# Patient Record
Sex: Female | Born: 1965 | Race: Asian | Hispanic: No | Marital: Married | Smoking: Never smoker
Health system: Southern US, Community
[De-identification: ages and names within clinical notes are randomized; demographics above are authoritative.]

## PROBLEM LIST (undated history)

## (undated) DIAGNOSIS — J302 Other seasonal allergic rhinitis: Secondary | ICD-10-CM

## (undated) DIAGNOSIS — M8580 Other specified disorders of bone density and structure, unspecified site: Secondary | ICD-10-CM

## (undated) DIAGNOSIS — T7840XA Allergy, unspecified, initial encounter: Secondary | ICD-10-CM

## (undated) DIAGNOSIS — D509 Iron deficiency anemia, unspecified: Secondary | ICD-10-CM

## (undated) HISTORY — DX: Other seasonal allergic rhinitis: J30.2

## (undated) HISTORY — DX: Iron deficiency anemia, unspecified: D50.9

## (undated) HISTORY — DX: Other specified disorders of bone density and structure, unspecified site: M85.80

## (undated) HISTORY — DX: Allergy, unspecified, initial encounter: T78.40XA

## (undated) HISTORY — PX: OTHER SURGICAL HISTORY: SHX169

---

## 2009-06-03 ENCOUNTER — Other Ambulatory Visit: Admission: RE | Admit: 2009-06-03 | Discharge: 2009-06-03 | Payer: Self-pay | Admitting: Internal Medicine

## 2009-06-03 ENCOUNTER — Ambulatory Visit: Payer: Self-pay | Admitting: Diagnostic Radiology

## 2009-06-03 ENCOUNTER — Ambulatory Visit (HOSPITAL_BASED_OUTPATIENT_CLINIC_OR_DEPARTMENT_OTHER): Admission: RE | Admit: 2009-06-03 | Discharge: 2009-06-03 | Payer: Self-pay | Admitting: Internal Medicine

## 2009-06-03 ENCOUNTER — Ambulatory Visit: Payer: Self-pay | Admitting: Internal Medicine

## 2009-06-03 DIAGNOSIS — G5600 Carpal tunnel syndrome, unspecified upper limb: Secondary | ICD-10-CM | POA: Insufficient documentation

## 2009-06-03 DIAGNOSIS — R519 Headache, unspecified: Secondary | ICD-10-CM | POA: Insufficient documentation

## 2009-06-03 DIAGNOSIS — R51 Headache: Secondary | ICD-10-CM | POA: Insufficient documentation

## 2009-06-03 LAB — HM MAMMOGRAPHY: HM Mammogram: NEGATIVE

## 2009-06-10 ENCOUNTER — Ambulatory Visit: Payer: Self-pay | Admitting: Family

## 2009-06-10 LAB — CONVERTED CEMR LAB
Basophils Relative: 1 % (ref 0–1)
CO2: 25 meq/L (ref 19–32)
Chloride: 106 meq/L (ref 96–112)
Eosinophils Absolute: 0.1 10*3/uL (ref 0.0–0.7)
Glucose, Bld: 88 mg/dL (ref 70–99)
HDL: 59 mg/dL (ref >39)
Hemoglobin: 11.5 g/dL — ABNORMAL LOW (ref 12.0–15.0)
LDL Cholesterol: 112 mg/dL — ABNORMAL HIGH (ref 0–99)
Lymphs Abs: 2.1 10*3/uL (ref 0.7–4.0)
MCHC: 32.4 g/dL (ref 30.0–36.0)
MCV: 78 fL (ref 78.0–100.0)
Monocytes Absolute: 0.5 10*3/uL (ref 0.1–1.0)
Monocytes Relative: 8 % (ref 3–12)
Neutro Abs: 3.8 10*3/uL (ref 1.7–7.7)
Neutrophils Relative %: 58 % (ref 43–77)
RBC: 4.55 M/uL (ref 3.87–5.11)
Sodium: 139 meq/L (ref 135–145)
Total CHOL/HDL Ratio: 3.1
VLDL: 10 mg/dL (ref 0–40)
WBC: 6.6 10*3/uL (ref 4.0–10.5)

## 2009-06-13 ENCOUNTER — Telehealth (INDEPENDENT_AMBULATORY_CARE_PROVIDER_SITE_OTHER): Payer: Self-pay | Admitting: *Deleted

## 2009-06-13 DIAGNOSIS — D509 Iron deficiency anemia, unspecified: Secondary | ICD-10-CM | POA: Insufficient documentation

## 2009-06-21 ENCOUNTER — Encounter (INDEPENDENT_AMBULATORY_CARE_PROVIDER_SITE_OTHER): Payer: Self-pay | Admitting: *Deleted

## 2009-07-01 ENCOUNTER — Encounter (INDEPENDENT_AMBULATORY_CARE_PROVIDER_SITE_OTHER): Payer: Self-pay | Admitting: *Deleted

## 2009-07-01 ENCOUNTER — Ambulatory Visit: Payer: Self-pay | Admitting: Family

## 2009-07-01 LAB — CONVERTED CEMR LAB
Iron: 56 ug/dL (ref 42–145)
UIBC: 270 ug/dL

## 2009-07-11 ENCOUNTER — Ambulatory Visit: Payer: Self-pay | Admitting: Family

## 2009-07-11 LAB — CONVERTED CEMR LAB: Fecal Occult Bld: NEGATIVE

## 2009-09-02 ENCOUNTER — Ambulatory Visit: Payer: Self-pay | Admitting: Family

## 2009-09-02 LAB — CONVERTED CEMR LAB
Eosinophils Relative: 2 % (ref 0–5)
HCT: 36.3 % (ref 36.0–46.0)
Hemoglobin: 11.8 g/dL — ABNORMAL LOW (ref 12.0–15.0)
Lymphocytes Relative: 38 % (ref 12–46)
Lymphs Abs: 2.1 10*3/uL (ref 0.7–4.0)
MCV: 78.9 fL (ref 78.0–100.0)
Monocytes Absolute: 0.4 10*3/uL (ref 0.1–1.0)
Monocytes Relative: 8 % (ref 3–12)
Platelets: 269 10*3/uL (ref 150–400)
RBC: 4.6 M/uL (ref 3.87–5.11)
WBC: 5.5 10*3/uL (ref 4.0–10.5)

## 2009-09-05 ENCOUNTER — Encounter: Payer: Self-pay | Admitting: Family

## 2010-03-24 ENCOUNTER — Ambulatory Visit (HOSPITAL_BASED_OUTPATIENT_CLINIC_OR_DEPARTMENT_OTHER): Admission: RE | Admit: 2010-03-24 | Discharge: 2010-03-24 | Payer: Self-pay | Admitting: Internal Medicine

## 2010-03-24 ENCOUNTER — Ambulatory Visit: Payer: Self-pay | Admitting: Diagnostic Radiology

## 2010-03-24 ENCOUNTER — Ambulatory Visit: Payer: Self-pay | Admitting: Family

## 2010-03-24 LAB — CONVERTED CEMR LAB
Eosinophils Absolute: 0.1 10*3/uL (ref 0.0–0.7)
Hemoglobin: 11.1 g/dL — ABNORMAL LOW (ref 12.0–15.0)
Lymphs Abs: 2.8 10*3/uL (ref 0.7–4.0)
MCV: 78 fL (ref 78.0–100.0)
Monocytes Absolute: 0.6 10*3/uL (ref 0.1–1.0)
Monocytes Relative: 8 % (ref 3–12)
Neutrophils Relative %: 51 % (ref 43–77)
RBC: 4.36 M/uL (ref 3.87–5.11)
WBC: 7.2 10*3/uL (ref 4.0–10.5)

## 2010-03-27 ENCOUNTER — Encounter: Payer: Self-pay | Admitting: Family

## 2010-05-26 ENCOUNTER — Other Ambulatory Visit
Admission: RE | Admit: 2010-05-26 | Discharge: 2010-05-26 | Payer: Self-pay | Source: Home / Self Care | Admitting: Internal Medicine

## 2010-05-26 ENCOUNTER — Other Ambulatory Visit: Payer: Self-pay | Admitting: Family

## 2010-05-26 ENCOUNTER — Ambulatory Visit
Admission: RE | Admit: 2010-05-26 | Discharge: 2010-05-26 | Payer: Self-pay | Source: Home / Self Care | Attending: Family | Admitting: Family

## 2010-05-28 ENCOUNTER — Telehealth: Payer: Self-pay | Admitting: Family

## 2010-06-06 NOTE — Assessment & Plan Note (Signed)
Summary: f/u on labs- jr   Vital Signs:  Patient profile:   45 year old female Menstrual status:  regular Weight:      110.25 pounds BMI:     21.61 Temp:     98.4 degrees F oral Pulse rate:   84 / minute Pulse rhythm:   regular Resp:     12 per minute BP sitting:   118 / 78  (right arm) Cuff size:   regular  Vitals Entered By: Mervin Kung CMA (July 01, 2009 2:27 PM) CC: room 5  follow up on labs` Is Patient Diabetic? No   CC:  room 5  follow up on labs`.  History of Present Illness: Ms Nicole Schneider presents today for follow up of her microcytic anemia.  Notes that her period lasts 1-2 days and is generally very light.  Denies black stools.    Allergies (verified): No Known Drug Allergies  Physical Exam  General:  Well-developed,well-nourished,in no acute distress; alert,appropriate and cooperative throughout examination Lungs:  Normal respiratory effort, chest expands symmetrically. Lungs are clear to auscultation, no crackles or wheezes. Heart:  Normal rate and regular rhythm. S1 and S2 normal without gallop, murmur, click, rub or other extra sounds. Abdomen:  Bowel sounds positive,abdomen soft and non-tender without masses, organomegaly or hernias noted.   Impression & Recommendations:  Problem # 1:  ANEMIA, IRON DEFICIENCY, MICROCYTIC (ICD-280.8) Assessment New Plan to check labs and IFOB- add iron supplement. Plan f/u CBC in 2 months.  Orders: Iron Binding Cap (TIBC)-[facility] ([id number]) T-Iron [id number]) Ferritin-[facility] [phone]) T-Vitamin B12 [phone]) Folate-[facility] [phone])  Her updated medication list for this problem includes:    Ferrous Sulfate 325 (65 Fe) Mg Tabs (Ferrous sulfate) ..... One tablet by mouth two times a day  Complete Medication List: 1)  Vicks Dayquil Multi-symptom 10-5-325 Mg Caps (Dm-phenylephrine-acetaminophen) .... As needed 2)  Vicks Nyquil Multi-symptom 15-6.25-325 Mg Caps (Dm-doxylamine-acetaminophen) .... As needed 3)   Ferrous Sulfate 325 (65 Fe) Mg Tabs (Ferrous sulfate) .... One tablet by mouth two times a day  Patient Instructions: 1)  Please complete your stool studies and return by mail. 2)  Please start iron and follow up in 2 months for bloodwork 3)  CBC (280.8)  Current Allergies (reviewed today): No known allergies

## 2010-06-06 NOTE — Letter (Signed)
   [place] at [facility] [address] [place], [place]  [address]  [place] Phone: [phone]      Sep 05, 2009   [facility] [place] [address] [place], [place] [address]  RE:  LAB RESULTS  Dear  Ms. Ziomara,  The following is an interpretation of your most recent lab tests.  Please take note of any instructions provided or changes to medications that have resulted from your lab work.   CBC:  Stable - no changes needed Your blood count is slightly improved.  Please continue your iron supplement. Please follow up in 3 months.   Sincerely Yours,    Lemont Fillers FNP

## 2010-06-06 NOTE — Letter (Signed)
   [place] at [facility] [address] [place], [place]  [address]  [place] Phone: [phone]      July 11, 2009   [facility] [place] [address] [place], [place] [address]  RE:  LAB RESULTS  Dear  Ms. Clay,  The following is an interpretation of your most recent lab tests.  Please take note of any instructions provided or changes to medications that have resulted from your lab work.  Hemoccult Test for blood in stool:  negative    Sincerely Yours,    Lemont Fillers FNP

## 2010-06-06 NOTE — Assessment & Plan Note (Signed)
Summary: new to est.- jr   Vital Signs:  Patient profile:   45 year old female Menstrual status:  regular LMP:     05/20/2009 Height:      60 inches Weight:      108 pounds BMI:     21.17 Pulse rate:   68 / minute BP sitting:   120 / 68  (right arm)  Vitals Entered By: Doristine Devoid (June 03, 2009 2:54 PM) CC: NEW EST- cpx and pap- also would like mammogram LMP (date): 05/20/2009     Menstrual Status regular Enter LMP: 05/20/2009   CC:  NEW EST- cpx and pap- also would like mammogram.  History of Present Illness: Ms Nicole Schneider is a 45 year old Burmese female who presents today to establish care.  She has not seen a doctor in ove 10 years since she moved to the [place].  Today she is accompanied by her son who assists in translating as her English is limited.  Patient complains of numbness in her hands and some low back pain. She has also had headaches in the AM for several weeks.  She also has had some associated nausea in the morning.    Preventative-  She had a mammogram 3 yrs ago- only had 1 pap smear which was in 2001.  Does not exercise.  Diet is reasonably healthy.  Had flu shot, needs Td.    Preventive Screening-Counseling & Management  Alcohol-Tobacco     Smoking Status: never      Drug Use:  no.    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   Smoking Status:  never Drug Use:  no  Review of Systems       Constitutional: Denies Fever ENT:  Denies nasal congestion or sore throat. Resp: Denies cough, some mild congestion/throat clearing CV:   Denies Chest Pain GI:  Denies nausea or vomitting GU: Denies dysuria Lymphatic: Denies lymphadenopathy Musculoskeletal:  notes that her hands are numb in Am when she wakes up then get better Skin:  Denies Rashes Psychiatric: Denies depression or anxiety Neuro: + AM numbness in bilateral hands.       Physical Exam  General:  thin asian female in NAD Head:  Normocephalic and atraumatic without obvious abnormalities. No apparent alopecia or balding. Eyes:  No corneal or conjunctival inflammation noted. EOMI. Perrla. Funduscopic exam benign, without hemorrhages, exudates or papilledema. Vision grossly normal. Ears:  External ear exam shows no significant lesions or deformities.  Otoscopic examination reveals clear canals, tympanic membranes are intact bilaterally without bulging, retraction, inflammation or discharge. Hearing is grossly normal bilaterally. Mouth:  Oral mucosa and oropharynx without lesions or exudates.   Neck:  No deformities, masses, or tenderness noted. No thyroid enlargement noted.   Breasts:  No mass, nodules, thickening, tenderness, bulging, retraction, inflamation, nipple discharge or skin changes noted.   Lungs:  Normal respiratory effort, chest expands symmetrically. Lungs are clear to auscultation, no crackles or wheezes. Heart:  Normal rate and regular rhythm. S1 and S2 normal without gallop, murmur, click, rub or other extra sounds. Abdomen:  Bowel sounds positive,abdomen soft and  non-tender without masses, organomegaly or hernias noted. Genitalia:  Pelvic Exam:        External: normal female genitalia without lesions or masses        Vagina: normal without lesions or masses        Cervix: normal without lesions or masses        Adnexa: normal bimanual exam without masses or fullness        Uterus: normal by palpation        Pap smear: performed Msk:  No deformity or scoliosis noted of thoracic or lumbar spine.   Extremities:  No clubbing, cyanosis, edema, or  deformity noted with normal full range of motion of all joints.   Neurologic:  alert & oriented X3, strength normal in all extremities, gait normal, and DTRs symmetrical and normal.   Skin:  Intact without suspicious lesions or rashes Cervical Nodes:  No lymphadenopathy noted Axillary Nodes:  No palpable lymphadenopathy Psych:  Cognition and judgment appear intact. Alert and cooperative with normal attention span and concentration. No apparent delusions, illusions, hallucinations   Impression & Recommendations:  Problem # 1:  Preventive Health Care (ICD-V70.0) Assessment Comment Only  Will have patient return fasting for blood work.  Patient counselled on healthy diet/exercise.  Immunizations reviewed- patient recieved tetanus shot.   Pap performed today.  Will refer for mammogram.    Orders: Pap Smear, Thin Prep ( Collection of) (K4401)  Problem # 2:  CARPAL TUNNEL SYNDROME, BILATERAL (ICD-354.0) Assessment: New recommended bilateral wrist braces, ibuprofen.  Patient works with her hands and performs repetative motions.  If no improvement with these measures, may consider referral to orthopedics.  Problem # 3:  HEADACHE (ICD-784.0) Assessment: New patient notes AM headaches for last several weeks which is accompanied by nausea.  Urine pregnancy test is negative.  NSAIDS as needed, monitor for now.  If HA's persist, will consider further w/u.    Other Orders: Mammogram (Screening) (Mammo) TD Toxoids IM 7 YR + (02725) Admin 1st Vaccine (36644) Urine Pregnancy Test  (03474)  Patient Instructions: 1)  Please return fasting for the following blood tests: 2)  CBC, BMET, FLP (ICD-9 V70) 3)  It is important that you exercise regularly at least 20 minutes 5 times a week. If you develop chest pain, have severe difficulty breathing, or feel very tired , stop exercising immediately and seek medical attention. 4)  Use wrist braces on both hands when you sleep at night, you can try using  ibuprofen 400mg  every 6 hours as needed for the symptoms of hand numbness.  Let us know if these symptoms do not improve and we can send you to a specialist.   5)  Please stop by the radiology department on the 1st floor on your way out to arrange your mammogram.   6)  Please schedule a follow-up appointment in 1 month.   Tetanus Vaccine (to be given today)  Laboratory Results   Urine Tests      Urine HCG: negative

## 2010-06-06 NOTE — Progress Notes (Signed)
Summary: discuss labs-lmomx2  Phone Note Outgoing Call   Summary of Call: Please ask Ms Nicole Schneider to return for an anemia panel and IFOB (280.8).  Thanks Initial call taken by: Lemont Fillers FNP,  June 13, 2009 12:29 PM  Follow-up for Phone Call        left message on machine .........Marland KitchenDoristine Schneider  June 14, 2009 9:24 AM   left message on machine .Marland KitchenMarland KitchenDoristine Schneider  June 16, 2009 2:03 PM   will mail letter to have patient contact office to discuss.........Marland KitchenDoristine Schneider  June 21, 2009 10:29 AM   New Problems: ANEMIA, IRON DEFICIENCY, MICROCYTIC (ICD-280.8)   New Problems: ANEMIA, IRON DEFICIENCY, MICROCYTIC (ICD-280.8)

## 2010-06-06 NOTE — Letter (Signed)
Summary: [facility]: Immunoassay Fecal Occult Blood (iFOB) Order Psychologist, counselling at [facility]  [address]   [place], [place] [address]   Phone: [phone]  Fax: [phone]      [facility]: Immunoassay Fecal Occult Blood (iFOB) Order Form   July 01, 2009 MRN: [id number]   [facility] Nicole Schneider 08-01-1965   Physicican Name:____Melissa O'Sullivan_______  Diagnosis Code:_______280.8___________________      Mervin Kung CMA

## 2010-06-06 NOTE — Assessment & Plan Note (Signed)
Summary: PAIN IN RIB CAGE/HEA--Rm 4   Vital Signs:  Patient profile:   45 year old female Menstrual status:  regular Height:      60 inches Weight:      105 pounds BMI:     20.58 Temp:     97.8 degrees F oral Pulse rate:   60 / minute Pulse rhythm:   regular Resp:     16 per minute BP sitting:   110 / 70  (right arm) Cuff size:   regular  Vitals Entered By: Mervin Kung CMA Duncan Dull) (March 24, 2010 2:58 PM) CC: Pt states she is having intermittent left rib pain since falling down steps 3 months ago.  Wants flu shot today. Is Patient Diabetic? No Pain Assessment Patient in pain? no      Comments Pt would like flu shot today. Nicki Guadalajara Fergerson CMA Duncan Dull)  March 24, 2010 3:06 PM    Primary Care Provider:  Lemont Fillers FNP  CC:  Pt states she is having intermittent left rib pain since falling down steps 3 months ago.  Wants flu shot today.Marland Kitchen  History of Present Illness: Left sided rib pain 3 months ago- fell down the stairs to basement.  Tripped and fell forward down the stairss.  Denies head injury.  Pain is "inside" on left rib cage.  Intermittent in nature.   Not worsened with inspiration, worse with movement.  Denies shortness of breath.  Denies hemoptysis or cough.  Improved by nothing.  Son presents today with the patient and assists with translation.     Allergies (verified): No Known Drug Allergies  Past History:  Past Medical History: Last updated: 06/03/2009 none  Past Surgical History: Last updated: 06/03/2009 none  Family History: Reviewed history from 06/03/2009 and no changes required. CAD-no HTN-no DM-no STROKE-no COLON CA-no BREAST CA-no  Mom-deceased at 71 unknown cause Dad- 68 years old healthy  Patient has 9 siblings- all healthy to her knowledge  Social History: Reviewed history from 06/03/2009 and no changes required. 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    Review of Systems       see HPI  Physical Exam  General:  Well-developed,well-nourished,in no acute distress; alert,appropriate and cooperative throughout examination Head:  Normocephalic and atraumatic without obvious abnormalities. No apparent alopecia or balding. Neck:  No deformities, masses, or tenderness noted. Lungs:  Normal respiratory effort, chest expands symmetrically. Lungs are clear to auscultation, no crackles or wheezes. Heart:  Normal rate and regular rhythm. S1 and S2 normal without gallop, murmur, click, rub or other extra sounds. Msk:  No tenderness to palpation of L rib cage.   Extremities:  No clubbing, cyanosis, edema, or deformity noted  Psych:  Cognition and judgment appear intact. Alert and cooperative with normal attention span and concentration. No apparent delusions, illusions, hallucinations   Impression & Recommendations:  Problem # 1:  RIB PAIN, LEFT SIDED (ICD-786.50) Assessment New  S/p fall 3 months ago.  Will check urine HCG, if neg will send for x-ray.  Recommended tylenol as needed pain.    Orders: T-DG Ribs Unilateral*L* (71100)  Problem # 2:  ANEMIA, IRON DEFICIENCY, MICROCYTIC (ICD-280.8) Assessment: Comment Only IFOB was negative, pt reports continuing iron.  Will repeat CBC. Her updated medication list for this problem includes:    Ferrous Sulfate 325 (65 Fe) Mg Tabs (Ferrous sulfate) ..... One tablet by mouth two times a day  Orders:  TLB-CBC Platelet - w/Differential (85025-CBCD)  Complete Medication List: 1)  Ferrous Sulfate 325 (65 Fe) Mg Tabs (Ferrous sulfate) .... One tablet by mouth two times a day  Other Orders: Urine Pregnancy Test  ([address]) Admin 1st Vaccine (60454) Flu Vaccine 42yrs + (09811)  Patient Instructions: 1)  Please complete your x-ray and blood work downstairs today. 2)  You may use tylenol as needed for pain. 3)  Take 650-1000mg  of Tylenol every 4-6 hours as needed for  relief of pain. AVOID taking more than 4000mg   in a 24 hour period (can cause liver damage in higher doses). 4)  Call if symptoms worsen or do not improve.    Orders Added: 1)  T-DG Ribs Unilateral*L* [71100] 2)  TLB-CBC Platelet - w/Differential [85025-CBCD] 3)  Est. Patient Level III [91478] 4)  Urine Pregnancy Test  [81025] 5)  Admin 1st Vaccine [90471] 6)  Flu Vaccine 67yrs + [29562] 7)  Est. Patient Level IV [13086]    Current Allergies (reviewed today): No known allergies    Laboratory Results   Urine Tests   Date/Time Reported: Mervin Kung CMA Duncan Dull)  March 24, 2010 3:35 PM     Urine HCG: negative    Flu Vaccine Consent Questions     Do you have a history of severe allergic reactions to this vaccine? no    Any prior history of allergic reactions to egg and/or gelatin? no    Do you have a sensitivity to the preservative Thimersol? no    Do you have a past history of Guillan-Barre Syndrome? no    Do you currently have an acute febrile illness? no    Have you ever had a severe reaction to latex? no    Vaccine information given and explained to patient? yes    Are you currently pregnant? no    Lot Number:[id number]   Exp Date:11/04/2010   Site Given  Left Deltoid IM. Nicki Guadalajara Fergerson CMA Duncan Dull)  March 24, 2010 3:36 PM

## 2010-06-06 NOTE — Letter (Signed)
   [place] at [facility] [address] [place], [place]  [address]  [place] Phone: [phone]      June 10, 2009   [facility] [place] [address] [place], [place] [address]  RE:  LAB RESULTS  Dear  Ms. Ivee,  The following is an interpretation of your most recent lab tests.  Please take note of any instructions provided or changes to medications that have resulted from your lab work.  Pap Smear: normal   Sincerely Yours,    Lemont Fillers FNP

## 2010-06-06 NOTE — Letter (Signed)
   [place] at [facility] [address] [place], [place]  [address]  [place] Phone: [phone]      March 27, 2010   [facility] [place] [address] [place], [place] [address]  RE:  LAB RESULTS  Dear  Ms. Analiz,  The following is an interpretation of your most recent lab tests.  Please take note of any instructions provided or changes to medications that have resulted from your lab work.   CBC:  Stable - no changes needed You are still slightly anemic.  Continue your iron supplement.   Please follow up in 6 months.   Sincerely Yours,    Lemont Fillers FNP  Appended Document:  Mailed.

## 2010-06-06 NOTE — Letter (Signed)
Summary: Primary Care Appointment Letter  [place] [facility] [place]  [address] [place], [place] [address]   Phone: [phone]  Fax: [phone]    06/21/2009 MRN: [id number]  Nicole Schneider [address] [place], [place]  [address]  Dear [name],   Your Primary Care Physician Lemont Fillers FNP has indicated that:    _______it is time to schedule an appointment.    _______you missed your appointment on______ and need to call and          reschedule.    _______you need to have lab work done.    ____X___you need to contact the office discuss lab or test results.    _______you need to call to reschedule your appointment that is                       scheduled on _________.     Please call our office as soon as possible. Our phone number is [phone]_____. Our office is open 8a-5p, Monday through Friday.     Thank you,    [place] Primary [facility] Scheduler

## 2010-06-08 ENCOUNTER — Encounter: Payer: Self-pay | Admitting: Family

## 2010-06-08 NOTE — Assessment & Plan Note (Signed)
Summary: cpx/hea--rm5   Vital Signs:  Patient profile:   45 year old female Menstrual status:  regular Height:      60 inches Weight:      107.75 pounds BMI:     21.12 Temp:     97.8 degrees F oral Pulse rate:   78 / minute Pulse rhythm:   regular Resp:     16 per minute BP sitting:   96 / 64  (right arm) Cuff size:   regular  Vitals Entered By: Mervin Kung CMA Duncan Dull) (May 26, 2010 2:54 PM) Office visitIs Patient Diabetic? No Pain Assessment Patient in pain? no      Comments Pt also takes fish oil 1000mg  daily. Other med doses/directions are correct. Nicki Guadalajara Fergerson CMA Duncan Dull)  May 26, 2010 2:59 PM    Primary Care Provider:  Lemont Fillers FNP   History of Present Illness: Ms.  Noreta is a 45 year old female here today for a complete physical.    Preventative- reports healthy diet.  Not exercising regularly.  Patient is up-to-date on her tetanus shot. She does note that her menstrual cycle can be heavy at times.  Preventive Screening-Counseling & Management  Alcohol-Tobacco     Smoking Status: never  Allergies (verified): No Known Drug Allergies  Past History:  Past Medical History: Last updated: 06/03/2009 none  Past Surgical History: Last updated: 06/03/2009 none  Family History: Reviewed history from 06/03/2009 and no changes required. CAD-no HTN-no DM-no STROKE-no COLON CA-no BREAST CA-no  Mom-deceased at 57 unknown cause Dad- 107 years old healthy  Patient has 9 siblings- all healthy to her knowledge  Social History: Reviewed history from 06/03/2009 and no changes required. 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    Review of Systems       Constitutional: Denies Fever ENT:  Denies nasal congestion or sore throat. Resp: Denies cough CV:  Denies Chest Pain GI:  Denies nausea or vomitting GU: Denies dysuria Lymphatic: Denies  lymphadenopathy Musculoskeletal:  Denies muscle/joint pain Skin:  Denies Rashes Psychiatric: Denies depression  Neuro: occasional numbness in hands- uses wrist braces with improvement.    Physical Exam  General:  Well-developed,well-nourished,in no acute distress; alert,appropriate and cooperative throughout examination Head:  Normocephalic and atraumatic without obvious abnormalities. No apparent alopecia or balding. Eyes:  pupils equal round reactive to light and accommodation sclera clear Ears:  External ear exam shows no significant lesions or deformities.  Otoscopic examination reveals clear canals, tympanic membranes are intact bilaterally without bulging, retraction, inflammation or discharge. Hearing is grossly normal bilaterally. Mouth:  Oral mucosa and oropharynx without lesions or exudates.  Teeth in good repair. Neck:  No deformities, masses, or tenderness noted. Chest Wall:  No deformities, masses, or tenderness noted. Breasts:  No mass, nodules, thickening, tenderness, bulging, retraction, inflamation, nipple discharge or skin changes noted.   Lungs:  Normal respiratory effort, chest expands symmetrically. Lungs are clear to auscultation, no crackles or wheezes. Heart:  Normal rate and regular rhythm. S1 and S2 normal without gallop, murmur, click, rub or other extra sounds. Abdomen:  Bowel sounds positive,abdomen soft and non-tender without masses, organomegaly or hernias noted. Genitalia:  Pelvic Exam:        External: normal female genitalia without lesions or masses        Vagina: normal without lesions or masses        Cervix: normal without lesions or masses  Adnexa: normal bimanual exam without masses or fullness        Uterus: normal by palpation        Pap smear: performed Msk:  No deformity or scoliosis noted of thoracic or lumbar spine.   Extremities:  No clubbing, cyanosis, edema, or deformity noted with normal full range of motion of all joints.     Neurologic:  No cranial nerve deficits noted. Station and gait are normal. Plantar reflexes are down-going bilaterally. DTRs are symmetrical throughout. Sensory, motor and coordinative functions appear intact. Skin:  Intact without suspicious lesions or rashes Cervical Nodes:  No lymphadenopathy noted Axillary Nodes:  No palpable lymphadenopathy Psych:  Cognition and judgment appear intact. Alert and cooperative with normal attention span and concentration. No apparent delusions, illusions, hallucinations   Impression & Recommendations:  Problem # 1:  Preventive Health Care (ICD-V70.0) Assessment Comment Only Patient was counseled on importance of exercise. Immunizations reviewed and up-to-date patient was given referral for mammogram. Pap smear performed today. Patient to return for fasting laboratories. Future Orders: T-Basic Metabolic Panel [phone]) ... 05/29/2010 T-Lipid Profile [id number]) ... 05/29/2010 T-Hepatic Function [id number]) ... 05/29/2010 T-TSH [id number]) ... 05/29/2010  Complete Medication List: 1)  Ferrous Sulfate 325 (65 Fe) Mg Tabs (Ferrous sulfate) .... One tablet by mouth two times a day 2)  Fish Oil 1000 Mg Caps (Omega-3 fatty acids) .... Take 1 capsule by mouth once a day.  Other Orders: Mammogram (Screening) (Mammo) Specimen Handling ([id number]) Future Orders: T-Iron ([id number]) ... 05/29/2010  Patient Instructions: 1)  Try to walk 20 minutes a day. 2)  Please return fasting to the lab for blood work.   Orders Added: 1)  Mammogram (Screening) [Mammo] 2)  T-Basic Metabolic Panel [[id number]] 3)  T-Lipid Profile [[id number]] 4)  T-Hepatic Function [[id number]] 5)  T-TSH [[id number]] 6)  T-Iron [[id number]] 7)  Specimen Handling [99000] 8)  Est. Patient 40-64 years [60630]    Current Allergies (reviewed today): No known allergies     Preventive Care Screening     Pt due for pap smear and mammogram. Never had bone density or  colooscopy. Nicki Guadalajara Fergerson CMA (AAMA)  May 26, 2010 3:03 PM     Vital Signs:  Patient Profile:   45 year old female Height:     60 inches Weight:      107.75 pounds BMI:     21.12 Temp:     97.8 degrees F oral Pulse rate:   78 / minute Pulse rhythm:   regular Resp:     16 per minute BP sitting:   96 / 64 Cuff size:   regular

## 2010-06-08 NOTE — Progress Notes (Signed)
----   Converted from flag ---- ---- 05/26/2010 4:57 PM, Mervin Kung CMA (AAMA) wrote: Orders entered and faxed to the lab.   ---- 05/26/2010 3:21 PM, Lemont Fillers FNP wrote: please send the following labs   FLP, TSH, BMET, LFT- V70 serum iron (280.8) ------------------------------

## 2010-06-09 ENCOUNTER — Ambulatory Visit (HOSPITAL_COMMUNITY)
Admission: RE | Admit: 2010-06-09 | Discharge: 2010-06-09 | Disposition: A | Discharge status: Home / Self Care | Payer: BC Managed Care – HMO | Source: Ambulatory Visit | Attending: Internal Medicine | Admitting: Internal Medicine

## 2010-06-09 ENCOUNTER — Encounter: Payer: Self-pay | Admitting: Family

## 2010-06-09 ENCOUNTER — Ambulatory Visit (HOSPITAL_BASED_OUTPATIENT_CLINIC_OR_DEPARTMENT_OTHER)
Admission: RE | Admit: 2010-06-09 | Discharge: 2010-06-09 | Disposition: A | Discharge status: Home / Self Care | Payer: BC Managed Care – HMO | Source: Ambulatory Visit | Attending: Internal Medicine | Admitting: Internal Medicine

## 2010-06-09 ENCOUNTER — Other Ambulatory Visit: Payer: Self-pay | Admitting: Internal Medicine

## 2010-06-09 DIAGNOSIS — Z139 Encounter for screening, unspecified: Secondary | ICD-10-CM

## 2010-06-09 DIAGNOSIS — Z1231 Encounter for screening mammogram for malignant neoplasm of breast: Secondary | ICD-10-CM | POA: Insufficient documentation

## 2010-06-09 LAB — CONVERTED CEMR LAB
BUN: 14 mg/dL (ref 6–23)
Bilirubin, Direct: 0.2 mg/dL (ref 0.0–0.3)
Chloride: 107 meq/L (ref 96–112)
Cholesterol: 189 mg/dL (ref 0–200)
Glucose, Bld: 83 mg/dL (ref 70–99)
Indirect Bilirubin: 0.6 mg/dL (ref 0.0–0.9)
Iron: 128 ug/dL (ref 42–145)
LDL Cholesterol: 116 mg/dL — ABNORMAL HIGH (ref 0–99)
Potassium: 4.8 meq/L (ref 3.5–5.3)
VLDL: 9 mg/dL (ref 0–40)

## 2010-06-14 NOTE — Letter (Signed)
Summary: Generic Letter  [place] at [facility]  [address]   [place], [place] [address]   Phone: [phone]  Fax: [phone]     06/08/2010    Nicole Schneider [address] [place], [place]  [address]  Dear Ms. Nicole Schneider,  We have been unable to contact you by phone and have some important information to share with you.   Please call our office at [phone] Monday through Friday from 8am to 5pm. Also, please provide [place] with an updated telephone number if one is available.   Sincerely,    Mervin Kung CMA (AAMA)  Appended Document: Generic Letter Mailed.

## 2010-06-14 NOTE — Letter (Addendum)
   [place] at [facility] [address] [place], [place]  [address]  [place] Phone: [phone]      June 09, 2010   [facility] [address] [place], [place] [address]  RE:  LAB RESULTS  Dear  Ms. Stepfanie,  The following is an interpretation of your most recent lab tests.  Please take note of any instructions provided or changes to medications that have resulted from your lab work.  ELECTROLYTES:  Good - no changes needed  KIDNEY FUNCTION TESTS:  Good - no changes needed  LIVER FUNCTION TESTS:  Good - no changes needed  LIPID PANEL:  Good - no changes needed Triglyceride: 43   Cholesterol: 189   LDL: 116   HDL: 64   Chol/HDL%:  3.0 Ratio  THYROID STUDIES:  Thyroid studies normal TSH: 1.363     DIABETIC STUDIES:  Excellent - no changes needed Blood Glucose: 83     Sincerely Yours,    Lemont Fillers FNP  Appended Document:  Mailed.

## 2010-06-27 ENCOUNTER — Telehealth: Payer: Self-pay | Admitting: Family

## 2010-06-30 ENCOUNTER — Other Ambulatory Visit: Payer: Self-pay | Admitting: Family

## 2010-06-30 ENCOUNTER — Other Ambulatory Visit (HOSPITAL_COMMUNITY)
Admission: RE | Admit: 2010-06-30 | Discharge: 2010-06-30 | Disposition: A | Discharge status: Home / Self Care | Payer: BC Managed Care – HMO | Source: Ambulatory Visit | Attending: Internal Medicine | Admitting: Internal Medicine

## 2010-06-30 ENCOUNTER — Ambulatory Visit (INDEPENDENT_AMBULATORY_CARE_PROVIDER_SITE_OTHER): Payer: BC Managed Care – HMO | Admitting: Family

## 2010-06-30 ENCOUNTER — Encounter: Payer: Self-pay | Admitting: Family

## 2010-06-30 DIAGNOSIS — Z01419 Encounter for gynecological examination (general) (routine) without abnormal findings: Secondary | ICD-10-CM

## 2010-06-30 LAB — HM PAP SMEAR

## 2010-07-04 NOTE — Assessment & Plan Note (Signed)
Summary: pap smear--rm 5   Vital Signs:  Patient profile:   45 year old female Menstrual status:  regular LMP:     06/27/2010 Height:      60 inches Weight:      107 pounds BMI:     20.97 Temp:     98.0 degrees F oral Pulse rate:   78 / minute Pulse rhythm:   regular Resp:     16 per minute BP sitting:   116 / 70  (right arm) Cuff size:   regular  Vitals Entered By: Nicole Schneider CMA Duncan Dull) (June 30, 2010 2:50 PM) CC: Pt here for repeat pap smear Is Patient Diabetic? No Pain Assessment Patient in pain? no      LMP (date): 06/27/2010     Enter LMP: 06/27/2010 Last PAP Result NEGATIVE FOR INTRAEPITHELIAL LESIONS OR MALIGNANCY.   Primary Care Provider:  Lemont Fillers FNP  CC:  Pt here for repeat pap smear.  History of Present Illness: Nicole Schneider is a 45 year old female who presents today for a repeat pap.  Last pap lacked endocervical component.  No complaints.  Allergies (verified): No Known Drug Allergies  Past History:  Past Medical History: Last updated: 06/03/2009 none  Past Surgical History: Last updated: 06/03/2009 none  Physical Exam  General:  Well-developed,well-nourished,in no acute distress; alert,appropriate and cooperative throughout examination Lungs:  Normal respiratory effort, chest expands symmetrically. Lungs are clear to auscultation, no crackles or wheezes. Heart:  Normal rate and regular rhythm. S1 and S2 normal without gallop, murmur, click, rub or other extra sounds. Genitalia:  Pelvic Exam:        External: normal female genitalia without lesions or masses        Vagina: normal without lesions or masses        Cervix: normal without lesions or masses- small OS noted.        Pap smear: performed   Impression & Recommendations:  Problem # 1:  ROUTINE GYNECOLOGICAL EXAMINATION (ICD-V72.31) Repeat pap performed today- hopefully endocervical component was obtained today.    Complete Medication List: 1)  Ferrous  Sulfate 325 (65 Fe) Mg Tabs (Ferrous sulfate) .... One tablet by mouth two times a day 2)  Fish Oil 1000 Mg Caps (Omega-3 fatty acids) .... Take 1 capsule by mouth once a day.   Orders Added: 1)  Est. Patient Level II [40102]    Current Allergies (reviewed today): No known allergies

## 2010-07-04 NOTE — Letter (Signed)
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 [address]   [place], [place] [address]   Phone: [phone]  Fax: [phone]      June 30, 2010   Employee:  [employer]    To Whom It May Concern:   For Medical reasons, please excuse the above named employee from work for the following dates:  Start:   06-30-10    End:   06-30-10  If you need additional information, please feel free to contact our office.         Sincerely,    Mervin Kung CMA (AAMA) Sandford Craze, NP

## 2010-07-04 NOTE — Progress Notes (Addendum)
Summary: appointments  Phone Note Call from Patient Call back at [phone]   Caller: Daughter Call For: nurse Summary of Call: pt daughter called and said that she received letter regarding pt lab results. pt daughter is a little confused on what pt needs to do next. pt daughter said something about being referred to have a mammogram??please assist. Initial call taken by: Elba Barman,  June 27, 2010 1:19 PM  Follow-up for Phone Call        Spoke to pt's son, pap smear scheduled for 06/30/10 @ 2:45. Transferred son to [facility] to schedule pt's screening mammogram. Mervin Kung CMA Duncan Dull)  June 27, 2010 2:55 PM      Appended Document: appointments Spoke to Lupita Leash in Imaging and she states that pt had mammogram on 06/09/10 and report is being dictated.

## 2010-07-05 ENCOUNTER — Encounter: Payer: Self-pay | Admitting: Family

## 2010-07-13 NOTE — Letter (Signed)
   [place] at [facility] [address] [place], [place]  [address]  [place] Phone: [phone]      July 05, 2010   [facility] Nicole Schneider [address] [place], [place] [address]  RE:  LAB RESULTS  Dear  Ms. [name],  The following is an interpretation of your most recent lab tests.  Please take note of any instructions provided or changes to medications that have resulted from your lab work.  Pap Smear: normal - follow up in 1 year     Sincerely Yours,    Lemont Fillers FNP

## 2010-12-14 ENCOUNTER — Encounter: Payer: Self-pay | Admitting: Family

## 2013-03-20 ENCOUNTER — Encounter: Payer: Self-pay | Admitting: Family

## 2013-03-20 ENCOUNTER — Ambulatory Visit (INDEPENDENT_AMBULATORY_CARE_PROVIDER_SITE_OTHER): Payer: Managed Care, Other (non HMO) | Admitting: Family

## 2013-03-20 VITALS — BP 132/80 | HR 69 | Temp 97.8°F | Resp 16 | Ht 60.0 in | Wt 115.1 lb

## 2013-03-20 DIAGNOSIS — D508 Other iron deficiency anemias: Secondary | ICD-10-CM

## 2013-03-20 DIAGNOSIS — D649 Anemia, unspecified: Secondary | ICD-10-CM

## 2013-03-20 DIAGNOSIS — Z23 Encounter for immunization: Secondary | ICD-10-CM

## 2013-03-20 LAB — CBC WITH DIFFERENTIAL/PLATELET
Eosinophils Absolute: 0.1 10*3/uL (ref 0.0–0.7)
Lymphocytes Relative: 38 % (ref 12–46)
Lymphs Abs: 3 10*3/uL (ref 0.7–4.0)
Neutrophils Relative %: 49 % (ref 43–77)
Platelets: 242 10*3/uL (ref 150–400)
RBC: 4.35 MIL/uL (ref 3.87–5.11)
WBC: 8 10*3/uL (ref 4.0–10.5)

## 2013-03-20 NOTE — Progress Notes (Signed)
  Subjective:    Patient ID: Nicole Schneider, female    DOB: 1965/11/05, 47 y.o.   MRN: [id number]  HPI  [name] is a 47 yr old female who presents today for follow up.    Anemia- Reports heavy periods, no cramping. She had some dizziness on omega/fish oil. Therefore she stopped.    Wt Readings from Last 3 Encounters:  03/20/13 115 lb 1.3 oz (52.2 kg)  06/30/10 107 lb (48.535 kg)  03/24/10 105 lb (47.628 kg)    Review of Systems    see HPI  No past medical history on file.  History   Social History  . Marital Status: Married    Spouse Name: N/A    Number of Children: N/A  . Years of Education: N/A   Occupational History  . Not on file.   Social History Main Topics  . Smoking status: Never Smoker   . Smokeless tobacco: Not on file  . Alcohol Use: No  . Drug Use: No  . Sexual Activity:    Other Topics Concern  . 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  No family history on file.  No Known Allergies  Current Outpatient Prescriptions on File Prior to Visit  Medication Sig Dispense Refill  . ferrous sulfate 325 (65 FE) MG tablet Take 325 mg by mouth 2 (two) times daily.        . Omega-3 Fatty Acids (FISH OIL) 1000 MG CPDR Take 1 capsule by mouth daily.         No current facility-administered medications on file prior to visit.    BP 132/80  Pulse 69  Temp(Src) 97.8 F (36.6 C) (Oral)  Resp 16  Ht 5' (1.524 m)  Wt 115 lb 1.3 oz (52.2 kg)  BMI 22.48 kg/m2  SpO2 99%    Objective:   Physical Exam  Constitutional: She is oriented to person, place, and time. She appears well-developed and well-nourished. No distress.  HENT:  Head: Normocephalic and atraumatic.  Cardiovascular: Normal rate and regular rhythm.   No murmur heard. Pulmonary/Chest: Effort normal and breath sounds normal. No respiratory distress. She has no wheezes. She has  no rales. She exhibits no tenderness.  Musculoskeletal: She exhibits no edema.  Neurological: She is alert and oriented to person, place, and time.  Psychiatric: She has a normal mood and affect. Her behavior is normal. Judgment and thought content normal.          Assessment & Plan:

## 2013-03-20 NOTE — Assessment & Plan Note (Signed)
Check CBC.  She will schedule physical.

## 2013-03-20 NOTE — Patient Instructions (Signed)
Please complete lab work prior to leaving. Schedule fasting physical at the front desk.  

## 2013-03-22 ENCOUNTER — Telehealth: Payer: Self-pay | Admitting: Family

## 2013-03-22 NOTE — Telephone Encounter (Signed)
Please call pt and let her know that her labs show she is still mildly anemia.  She should restart iron supplement please per med list.

## 2013-03-23 NOTE — Telephone Encounter (Signed)
Left message on son's voicemail to return my call.  

## 2013-03-25 NOTE — Telephone Encounter (Signed)
Mailed letter to pt

## 2013-03-30 ENCOUNTER — Encounter: Payer: Self-pay | Admitting: Family

## 2013-03-30 ENCOUNTER — Other Ambulatory Visit (HOSPITAL_COMMUNITY)
Admission: RE | Admit: 2013-03-30 | Discharge: 2013-03-30 | Disposition: A | Discharge status: Home / Self Care | Payer: Managed Care, Other (non HMO) | Source: Ambulatory Visit | Attending: Family | Admitting: Family

## 2013-03-30 ENCOUNTER — Other Ambulatory Visit: Payer: Self-pay | Admitting: Family

## 2013-03-30 ENCOUNTER — Ambulatory Visit (INDEPENDENT_AMBULATORY_CARE_PROVIDER_SITE_OTHER): Payer: Managed Care, Other (non HMO) | Admitting: Family

## 2013-03-30 VITALS — BP 112/80 | HR 64 | Temp 98.0°F | Resp 16 | Ht 60.0 in | Wt 113.1 lb

## 2013-03-30 DIAGNOSIS — Z136 Encounter for screening for cardiovascular disorders: Secondary | ICD-10-CM

## 2013-03-30 DIAGNOSIS — Z0000 Encounter for general adult medical examination without abnormal findings: Secondary | ICD-10-CM

## 2013-03-30 DIAGNOSIS — Z01419 Encounter for gynecological examination (general) (routine) without abnormal findings: Secondary | ICD-10-CM | POA: Insufficient documentation

## 2013-03-30 DIAGNOSIS — Z1231 Encounter for screening mammogram for malignant neoplasm of breast: Secondary | ICD-10-CM

## 2013-03-30 DIAGNOSIS — Z1151 Encounter for screening for human papillomavirus (HPV): Secondary | ICD-10-CM | POA: Insufficient documentation

## 2013-03-30 LAB — HEPATIC FUNCTION PANEL
AST: 15 U/L (ref 0–37)
Bilirubin, Direct: 0.1 mg/dL (ref 0.0–0.3)
Total Bilirubin: 0.6 mg/dL (ref 0.3–1.2)

## 2013-03-30 LAB — BASIC METABOLIC PANEL WITH GFR
CO2: 27 mEq/L (ref 19–32)
Calcium: 10.3 mg/dL (ref 8.4–10.5)
Chloride: 102 mEq/L (ref 96–112)
Sodium: 139 mEq/L (ref 135–145)

## 2013-03-30 LAB — LIPID PANEL
Cholesterol: 215 mg/dL — ABNORMAL HIGH (ref 0–200)
HDL: 59 mg/dL (ref >39)
Total CHOL/HDL Ratio: 3.6 Ratio
VLDL: 13 mg/dL (ref 0–40)

## 2013-03-30 NOTE — Assessment & Plan Note (Signed)
Continue healthy diet, exercise. She will return fasting for lab work.  She is instructed to resume iron due to anemia.  Refer for mammogram. EKG today. Pap performed.

## 2013-03-30 NOTE — Addendum Note (Signed)
Addended by: Mervin Kung A on: 03/30/2013 04:31 PM   Modules accepted: Orders

## 2013-03-30 NOTE — Progress Notes (Signed)
Pre visit review using our clinic review tool, if applicable. No additional management support is needed unless otherwise documented below in the visit note. 

## 2013-03-30 NOTE — Progress Notes (Signed)
Subjective:    Patient ID: Nicole Schneider, female    DOB: 03-17-1966, 47 y.o.   MRN: [id number]  HPI  Nicole Schneider is a 47 yr old female who presents today for cpx.  Immunizations: up to date Diet: reports healthy diet Exercise: some exercise Pap Smear: due Mammogram: due   Review of Systems  Constitutional: Negative for unexpected weight change.  HENT: Negative for rhinorrhea.   Respiratory: Negative for cough.   Cardiovascular: Negative for chest pain.  Gastrointestinal: Negative for vomiting and diarrhea.  Genitourinary: Negative for dysuria and menstrual problem.  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  Neurological:       Occasional HA's  Hematological: Positive for adenopathy.  Psychiatric/Behavioral:       Denies depression   History reviewed. No pertinent past medical history.  History   Social History  . Marital Status: Married    Spouse Name: N/A    Number of Children: N/A  . Years of Education: N/A   Occupational History  . Not on file.   Social History Main Topics  . Smoking status: Never Smoker   . Smokeless tobacco: Not on file  . Alcohol Use: No  . Drug Use: No  . Sexual Activity:    Other Topics Concern  . Not on file   Social History Narrative   Works [employer] at [employer]   Married   2 sons.   Grew up in [place].           History reviewed. No pertinent past surgical history.  History reviewed. No pertinent family history.  No Known Allergies  Current Outpatient Prescriptions on File Prior to Visit  Medication Sig Dispense Refill  . ferrous sulfate 325 (65 FE) MG tablet Take 325 mg by mouth 2 (two) times daily.        . Omega-3 Fatty Acids (FISH OIL) 1000 MG CPDR Take 1 capsule by mouth daily.         No current facility-administered medications on file prior to visit.    BP 112/80  Pulse 64  Temp(Src) 98 F (36.7 C) (Oral)  Resp 16  Ht 5' (1.524 m)  Wt 113 lb 1.3 oz  (51.293 kg)  BMI 22.08 kg/m2  SpO2 99%        Objective:   Physical Exam Physical Exam  Constitutional: She is oriented to person, place, and time. She appears well-developed and well-nourished. No distress.  HENT:  Head: Normocephalic and atraumatic.  Right Ear: Tympanic membrane and ear canal normal.  Left Ear: Tympanic membrane and ear canal normal.  Mouth/Throat: Oropharynx is clear and moist.  Eyes: Pupils are equal, round, and reactive to light. No scleral icterus.  Neck: Normal range of motion. No thyromegaly present.  Cardiovascular: Normal rate and regular rhythm.   No murmur heard. Pulmonary/Chest: Effort normal and breath sounds normal. No respiratory distress. He has no wheezes. She has no rales. She exhibits no tenderness.  Abdominal: Soft. Bowel sounds are normal. He exhibits no distension and no mass. There is no tenderness. There is no rebound and no guarding.  Musculoskeletal: She exhibits no edema.  Lymphadenopathy:    She has no cervical adenopathy.  Neurological: She is alert and oriented to person, place, and time.  She exhibits normal muscle tone. Coordination normal.  Skin: Skin is warm and dry.  Psychiatric: She has a normal mood and affect. Her behavior is  normal. Judgment and thought content normal.  Breasts: Examined lying Right: Without masses, retractions, discharge or axillary adenopathy.  Left: Without masses, retractions, discharge or axillary adenopathy.  Inguinal/mons: Normal without inguinal adenopathy  External genitalia: Normal  BUS/Urethra/Skene's glands: Normal  Bladder: Normal  Vagina: Normal  Cervix: Normal  Uterus: normal in size, shape and contour. Midline and mobile  Adnexa/parametria:  Rt: Without masses or tenderness.  Lt: Without masses or tenderness.  Anus and perineum: Normal           Assessment & Plan:          Assessment & Plan:

## 2013-03-30 NOTE — Patient Instructions (Addendum)
Schedule your mammogram on the first floor.  Return fasting for lab work- bring lab slip to the lab in the morning before you eat. Lab opens at 8 AM.   Start iron 325mg  once daily for anemia. Follow up in 6 months.

## 2013-03-31 LAB — URINALYSIS, ROUTINE W REFLEX MICROSCOPIC
Bilirubin Urine: NEGATIVE
Glucose, UA: NEGATIVE mg/dL
Hgb urine dipstick: NEGATIVE
Ketones, ur: NEGATIVE mg/dL
Leukocytes, UA: NEGATIVE
Protein, ur: NEGATIVE mg/dL

## 2013-04-05 ENCOUNTER — Encounter: Payer: Self-pay | Admitting: Family

## 2013-04-06 ENCOUNTER — Ambulatory Visit (HOSPITAL_BASED_OUTPATIENT_CLINIC_OR_DEPARTMENT_OTHER)
Admission: RE | Admit: 2013-04-06 | Discharge: 2013-04-06 | Disposition: A | Discharge status: Home / Self Care | Payer: Managed Care, Other (non HMO) | Source: Ambulatory Visit | Attending: Family | Admitting: Family

## 2013-04-06 DIAGNOSIS — Z1231 Encounter for screening mammogram for malignant neoplasm of breast: Secondary | ICD-10-CM | POA: Insufficient documentation

## 2013-04-07 ENCOUNTER — Other Ambulatory Visit: Payer: Self-pay | Admitting: Family

## 2013-04-07 DIAGNOSIS — R928 Other abnormal and inconclusive findings on diagnostic imaging of breast: Secondary | ICD-10-CM

## 2013-04-13 ENCOUNTER — Ambulatory Visit
Admission: RE | Admit: 2013-04-13 | Discharge: 2013-04-13 | Disposition: A | Discharge status: Home / Self Care | Payer: Managed Care, Other (non HMO) | Source: Ambulatory Visit | Attending: Family | Admitting: Family

## 2013-04-13 DIAGNOSIS — R928 Other abnormal and inconclusive findings on diagnostic imaging of breast: Secondary | ICD-10-CM

## 2013-04-16 NOTE — Progress Notes (Signed)
It shows that pt was supposed to go back on 04-13-13 for additional testing

## 2013-09-21 ENCOUNTER — Encounter: Payer: Self-pay | Admitting: Family

## 2013-09-21 ENCOUNTER — Ambulatory Visit (INDEPENDENT_AMBULATORY_CARE_PROVIDER_SITE_OTHER): Payer: Managed Care, Other (non HMO) | Admitting: Family

## 2013-09-21 VITALS — BP 124/72 | HR 75 | Temp 97.8°F | Resp 18 | Ht 60.0 in | Wt 109.0 lb

## 2013-09-21 DIAGNOSIS — D508 Other iron deficiency anemias: Secondary | ICD-10-CM

## 2013-09-21 DIAGNOSIS — R51 Headache: Secondary | ICD-10-CM

## 2013-09-21 DIAGNOSIS — D509 Iron deficiency anemia, unspecified: Secondary | ICD-10-CM

## 2013-09-21 LAB — IRON AND TIBC
%SAT: 29 % (ref 20–55)
IRON: 96 ug/dL (ref 42–145)
TIBC: 329 ug/dL (ref 250–470)
UIBC: 233 ug/dL (ref 125–400)

## 2013-09-21 LAB — CBC WITH DIFFERENTIAL/PLATELET
Basophils Absolute: 0.1 10*3/uL (ref 0.0–0.1)
Basophils Relative: 1 % (ref 0–1)
Eosinophils Absolute: 0.3 10*3/uL (ref 0.0–0.7)
Eosinophils Relative: 5 % (ref 0–5)
HCT: 33.3 % — ABNORMAL LOW (ref 36.0–46.0)
HEMOGLOBIN: 11.3 g/dL — AB (ref 12.0–15.0)
LYMPHS ABS: 1.6 10*3/uL (ref 0.7–4.0)
LYMPHS PCT: 27 % (ref 12–46)
MCH: 25.9 pg — ABNORMAL LOW (ref 26.0–34.0)
MCHC: 33.9 g/dL (ref 30.0–36.0)
MCV: 76.4 fL — ABNORMAL LOW (ref 78.0–100.0)
Monocytes Absolute: 0.5 10*3/uL (ref 0.1–1.0)
Monocytes Relative: 8 % (ref 3–12)
NEUTROS ABS: 3.5 10*3/uL (ref 1.7–7.7)
NEUTROS PCT: 59 % (ref 43–77)
PLATELETS: 244 10*3/uL (ref 150–400)
RBC: 4.36 MIL/uL (ref 3.87–5.11)
RDW: 14.5 % (ref 11.5–15.5)
WBC: 6 10*3/uL (ref 4.0–10.5)

## 2013-09-21 NOTE — Patient Instructions (Addendum)
Continue iron twice daily.  Follow up in December for annual physical.

## 2013-09-21 NOTE — Progress Notes (Signed)
Pre visit review using our clinic review tool, if applicable. No additional management support is needed unless otherwise documented below in the visit note. 

## 2013-09-21 NOTE — Assessment & Plan Note (Signed)
Rare, mild, resolve on own.  Monitor.

## 2013-09-21 NOTE — Progress Notes (Signed)
   Subjective:    Patient ID: Nicole Schneider, female    DOB: Jun 24, 1965, 49 y.o.   MRN: [id number]  HPI  Ms. Siren is a 48 yr old female who presents today for follow up of her anemia. She reports + compliance with her iron. Denies associated constipation from iron. Reports good energy level. Reports very little menstrual bleeding.  Headaches- reports HA about once a month, resolves on own.   Review of Systems See HPI  No past medical history on file.  History   Social History  . Marital Status: Married    Spouse Name: N/A    Number of Children: N/A  . Years of Education: N/A   Occupational History  . Not on file.   Social History Main Topics  . Smoking status: Never Smoker   . Smokeless tobacco: Not on file  . Alcohol Use: No  . Drug Use: No  . Sexual Activity:    Other Topics Concern  . Not on file   Social History Narrative   Works International aid/development worker at [employer]   Married   2 sons.   Grew up in [place].           No past surgical history on file.  No family history on file.  No Known Allergies  Current Outpatient Prescriptions on File Prior to Visit  Medication Sig Dispense Refill  . ferrous sulfate 325 (65 FE) MG tablet Take 325 mg by mouth 2 (two) times daily.        . Omega-3 Fatty Acids (FISH OIL) 1000 MG CPDR Take 1 capsule by mouth daily.         No current facility-administered medications on file prior to visit.    BP 124/72  Pulse 75  Temp(Src) 97.8 F (36.6 C) (Oral)  Resp 18  Ht 5' (1.524 m)  Wt 109 lb (49.442 kg)  BMI 21.29 kg/m2  SpO2 99%       Objective:   Physical Exam  Constitutional: She is oriented to person, place, and time. She appears well-developed and well-nourished. No distress.  Cardiovascular: Normal rate and regular rhythm.   No murmur heard. Pulmonary/Chest: Effort normal and breath sounds normal. No respiratory distress. She has no wheezes. She has no rales. She exhibits no tenderness.  Musculoskeletal: She  exhibits no edema.  Neurological: She is alert and oriented to person, place, and time.  Psychiatric: She has a normal mood and affect. Her behavior is normal. Judgment and thought content normal.          Assessment & Plan:

## 2013-09-21 NOTE — Assessment & Plan Note (Signed)
clinically stable. Obtain follow up CBC, iron studies.  Continue iron supplement.

## 2013-09-22 ENCOUNTER — Encounter: Payer: Self-pay | Admitting: Family

## 2013-09-22 LAB — FERRITIN: FERRITIN: 151 ng/mL (ref 10–291)

## 2014-04-21 ENCOUNTER — Ambulatory Visit (INDEPENDENT_AMBULATORY_CARE_PROVIDER_SITE_OTHER): Payer: BC Managed Care – PPO | Admitting: Family

## 2014-04-21 ENCOUNTER — Ambulatory Visit (INDEPENDENT_AMBULATORY_CARE_PROVIDER_SITE_OTHER): Payer: BC Managed Care – PPO | Admitting: *Deleted

## 2014-04-21 ENCOUNTER — Encounter: Payer: Self-pay | Admitting: Family

## 2014-04-21 VITALS — BP 100/68 | HR 67 | Temp 98.3°F | Resp 18 | Ht 60.0 in | Wt 109.4 lb

## 2014-04-21 DIAGNOSIS — Z23 Encounter for immunization: Secondary | ICD-10-CM

## 2014-04-21 DIAGNOSIS — Z0000 Encounter for general adult medical examination without abnormal findings: Secondary | ICD-10-CM

## 2014-04-21 DIAGNOSIS — D509 Iron deficiency anemia, unspecified: Secondary | ICD-10-CM

## 2014-04-21 LAB — HEPATIC FUNCTION PANEL
ALT: 18 U/L (ref 0–35)
AST: 23 U/L (ref 0–37)
Albumin: 4.5 g/dL (ref 3.5–5.2)
Alkaline Phosphatase: 54 U/L (ref 39–117)
BILIRUBIN DIRECT: 0 mg/dL (ref 0.0–0.3)
TOTAL PROTEIN: 7.6 g/dL (ref 6.0–8.3)
Total Bilirubin: 0.8 mg/dL (ref 0.2–1.2)

## 2014-04-21 LAB — BASIC METABOLIC PANEL
BUN: 17 mg/dL (ref 6–23)
CHLORIDE: 105 meq/L (ref 96–112)
CO2: 26 mEq/L (ref 19–32)
Calcium: 9.3 mg/dL (ref 8.4–10.5)
Creatinine, Ser: 0.7 mg/dL (ref 0.4–1.2)
GFR: 91.15 mL/min (ref >60.00)
Glucose, Bld: 91 mg/dL (ref 70–99)
POTASSIUM: 4.2 meq/L (ref 3.5–5.1)
Sodium: 141 mEq/L (ref 135–145)

## 2014-04-21 LAB — LIPID PANEL
CHOLESTEROL: 217 mg/dL — AB (ref 0–200)
HDL: 62.9 mg/dL (ref >39.00)
LDL Cholesterol: 139 mg/dL — ABNORMAL HIGH (ref 0–99)
NonHDL: 154.1
Total CHOL/HDL Ratio: 3
Triglycerides: 78 mg/dL (ref 0.0–149.0)
VLDL: 15.6 mg/dL (ref 0.0–40.0)

## 2014-04-21 LAB — CBC WITH DIFFERENTIAL/PLATELET
BASOS PCT: 0.2 % (ref 0.0–3.0)
Basophils Absolute: 0 10*3/uL (ref 0.0–0.1)
EOS ABS: 0.1 10*3/uL (ref 0.0–0.7)
EOS PCT: 1.3 % (ref 0.0–5.0)
HEMATOCRIT: 36.4 % (ref 36.0–46.0)
Hemoglobin: 11.8 g/dL — ABNORMAL LOW (ref 12.0–15.0)
LYMPHS ABS: 2.3 10*3/uL (ref 0.7–4.0)
Lymphocytes Relative: 32 % (ref 12.0–46.0)
MCHC: 32.3 g/dL (ref 30.0–36.0)
MCV: 79.3 fl (ref 78.0–100.0)
MONO ABS: 0.4 10*3/uL (ref 0.1–1.0)
Monocytes Relative: 5.8 % (ref 3.0–12.0)
NEUTROS PCT: 60.7 % (ref 43.0–77.0)
Neutro Abs: 4.3 10*3/uL (ref 1.4–7.7)
PLATELETS: 260 10*3/uL (ref 150.0–400.0)
RBC: 4.59 Mil/uL (ref 3.87–5.11)
RDW: 14.3 % (ref 11.5–15.5)
WBC: 7.1 10*3/uL (ref 4.0–10.5)

## 2014-04-21 LAB — TSH: TSH: 1.67 u[IU]/mL (ref 0.35–4.50)

## 2014-04-21 LAB — IRON: Iron: 92 ug/dL (ref 42–145)

## 2014-04-21 NOTE — Progress Notes (Signed)
Subjective:    Patient ID: Nicole Schneider, female    DOB: 08-20-1965, 48 y.o.   MRN: [id number]  HPI  Patient presents today for complete physical.  Immunizations: tetanus up to date, flu shot today Diet: healthy most of the time.  Exercise: walking Colonoscopy: LMP- reports small amount of bleeding 2 days ago- irregular menses Pap Smear:  Up to date Mammogram: due Eye exam- overdue Dental- 2 years ago.   Review of Systems  Constitutional: Negative for unexpected weight change.       Wt Readings from Last 3 Encounters: 09/21/13 : 109 lb (49.442 kg) 03/30/13 : 113 lb 1.3 oz (51.293 kg) 03/20/13 : 115 lb 1.3 oz (52.2 kg)   HENT: Negative for postnasal drip.   Eyes: Negative for visual disturbance.  Respiratory: Negative for cough.   Cardiovascular: Negative for chest pain.  Gastrointestinal: Negative for nausea, diarrhea and constipation.  Genitourinary: Negative for dysuria and frequency.  Musculoskeletal: Negative for arthralgias.  Skin: Negative for rash.  Neurological:       Occasional headaches  Hematological: Negative for adenopathy.  Psychiatric/Behavioral:       Denies depression/anxiety       History reviewed. No pertinent past medical history.  History   Social History  . Marital Status: Married    Spouse Name: N/A    Number of Children: N/A  . Years of Education: N/A   Occupational History  . Not on file.   Social History Main Topics  . Smoking status: Never Smoker   . Smokeless tobacco: Not on file  . Alcohol Use: No  . Drug Use: No  . Sexual Activity: Not on file   Other Topics Concern  . Not on file   Social History Narrative   Works International aid/development worker at [employer]   Married   2 sons.   Grew up in [place].           History reviewed. No pertinent past surgical history.  History reviewed. No pertinent family history.  No Known Allergies  Current Outpatient Prescriptions on File Prior to Visit  Medication Sig Dispense Refill    . ferrous sulfate 325 (65 FE) MG tablet Take 325 mg by mouth 2 (two) times daily.      . Omega-3 Fatty Acids (FISH OIL) 1000 MG CPDR Take 1 capsule by mouth daily.       No current facility-administered medications on file prior to visit.    There were no vitals taken for this visit.    Objective:   Physical Exam  Physical Exam  Constitutional: She is oriented to person, place, and time. She appears well-developed and well-nourished. No distress.  HENT:  Head: Normocephalic and atraumatic.  Right Ear: Tympanic membrane and ear canal normal.  Left Ear: Tympanic membrane and ear canal normal.  Mouth/Throat: Oropharynx is clear and moist.  Eyes: Pupils are equal, round, and reactive to light. No scleral icterus.  Neck: Normal range of motion. No thyromegaly present.  Cardiovascular: Normal rate and regular rhythm.   No murmur heard. Pulmonary/Chest: Effort normal and breath sounds normal. No respiratory distress. He has no wheezes. She has no rales. She exhibits no tenderness.  Abdominal: Soft. Bowel sounds are normal. He exhibits no distension and no mass. There is no tenderness. There is no rebound and no guarding.  Musculoskeletal: She exhibits no edema.  Lymphadenopathy:    She has no cervical adenopathy.  Neurological: She is alert and oriented to person, place, and time. She  has normal reflexes. She exhibits normal muscle tone. Coordination normal.  Skin: Skin is warm and dry.  Psychiatric: She has a normal mood and affect. Her behavior is normal. Judgment and thought content normal.  Breasts: Examined lying Right: Without masses, retractions, discharge or axillary adenopathy.  Left: Without masses, retractions, discharge or axillary adenopathy.  Pelvic: deferred          Assessment & Plan:        Assessment & Plan:

## 2014-04-21 NOTE — Addendum Note (Signed)
Addended by: Kelle Darting A on: 04/21/2014 11:36 AM   Modules accepted: Orders

## 2014-04-21 NOTE — Addendum Note (Signed)
Addended by: Peggyann Shoals on: 04/21/2014 02:56 PM   Modules accepted: Orders

## 2014-04-21 NOTE — Progress Notes (Signed)
Pre visit review using our clinic review tool, if applicable. No additional management support is needed unless otherwise documented below in the visit note. 

## 2014-04-21 NOTE — Assessment & Plan Note (Signed)
Continue healthy diet, exercise, obtain routine lab work. Refer for mammogram, colo, flu shot today.

## 2014-04-21 NOTE — Patient Instructions (Signed)
Please complete lab work prior to leaving. You will be contacted about your colonoscopy.  This is to screen you for colon cancer. Schedule your mammogram on the first floor. Schedule a follow up eye exam and dental exam.  Follow up in 1 year- sooner if problems/concerns.

## 2014-04-22 ENCOUNTER — Encounter: Payer: Self-pay | Admitting: Family

## 2014-04-27 ENCOUNTER — Ambulatory Visit (HOSPITAL_BASED_OUTPATIENT_CLINIC_OR_DEPARTMENT_OTHER)
Admission: RE | Admit: 2014-04-27 | Discharge: 2014-04-27 | Disposition: A | Discharge status: Home / Self Care | Payer: BC Managed Care – PPO | Source: Ambulatory Visit | Attending: Family | Admitting: Family

## 2014-04-27 DIAGNOSIS — Z1231 Encounter for screening mammogram for malignant neoplasm of breast: Secondary | ICD-10-CM | POA: Diagnosis not present

## 2014-04-27 DIAGNOSIS — Z0000 Encounter for general adult medical examination without abnormal findings: Secondary | ICD-10-CM

## 2015-04-26 ENCOUNTER — Ambulatory Visit (INDEPENDENT_AMBULATORY_CARE_PROVIDER_SITE_OTHER): Payer: BLUE CROSS/BLUE SHIELD | Admitting: Family

## 2015-04-26 ENCOUNTER — Encounter: Payer: Self-pay | Admitting: Family

## 2015-04-26 VITALS — BP 100/78 | HR 63 | Temp 97.9°F | Resp 16 | Ht 59.5 in | Wt 107.6 lb

## 2015-04-26 DIAGNOSIS — Z0000 Encounter for general adult medical examination without abnormal findings: Secondary | ICD-10-CM

## 2015-04-26 LAB — HEPATIC FUNCTION PANEL
ALBUMIN: 4.6 g/dL (ref 3.5–5.2)
ALT: 18 U/L (ref 0–35)
AST: 20 U/L (ref 0–37)
Alkaline Phosphatase: 41 U/L (ref 39–117)
Bilirubin, Direct: 0.1 mg/dL (ref 0.0–0.3)
TOTAL PROTEIN: 7.5 g/dL (ref 6.0–8.3)
Total Bilirubin: 0.7 mg/dL (ref 0.2–1.2)

## 2015-04-26 LAB — LIPID PANEL
CHOLESTEROL: 215 mg/dL — AB (ref 0–200)
HDL: 58.3 mg/dL (ref >39.00)
LDL CALC: 142 mg/dL — AB (ref 0–99)
NonHDL: 156.93
TRIGLYCERIDES: 74 mg/dL (ref 0.0–149.0)
Total CHOL/HDL Ratio: 4
VLDL: 14.8 mg/dL (ref 0.0–40.0)

## 2015-04-26 LAB — TSH: TSH: 1.73 u[IU]/mL (ref 0.35–4.50)

## 2015-04-26 LAB — URINALYSIS, ROUTINE W REFLEX MICROSCOPIC
Bilirubin Urine: NEGATIVE
Hgb urine dipstick: NEGATIVE
KETONES UR: NEGATIVE
Leukocytes, UA: NEGATIVE
NITRITE: POSITIVE — AB
RBC / HPF: NONE SEEN (ref 0–?)
SPECIFIC GRAVITY, URINE: 1.02 (ref 1.000–1.030)
Total Protein, Urine: NEGATIVE
Urine Glucose: NEGATIVE
Urobilinogen, UA: 0.2 (ref 0.0–1.0)
pH: 5 (ref 5.0–8.0)

## 2015-04-26 LAB — CBC WITH DIFFERENTIAL/PLATELET
BASOS PCT: 0.2 % (ref 0.0–3.0)
Basophils Absolute: 0 10*3/uL (ref 0.0–0.1)
EOS PCT: 1.8 % (ref 0.0–5.0)
Eosinophils Absolute: 0.1 10*3/uL (ref 0.0–0.7)
HCT: 36 % (ref 36.0–46.0)
HEMOGLOBIN: 11.6 g/dL — AB (ref 12.0–15.0)
Lymphocytes Relative: 36.8 % (ref 12.0–46.0)
Lymphs Abs: 2.6 10*3/uL (ref 0.7–4.0)
MCHC: 32.2 g/dL (ref 30.0–36.0)
MCV: 79.2 fl (ref 78.0–100.0)
MONOS PCT: 6.5 % (ref 3.0–12.0)
Monocytes Absolute: 0.5 10*3/uL (ref 0.1–1.0)
Neutro Abs: 3.8 10*3/uL (ref 1.4–7.7)
Neutrophils Relative %: 54.7 % (ref 43.0–77.0)
Platelets: 220 10*3/uL (ref 150.0–400.0)
RBC: 4.55 Mil/uL (ref 3.87–5.11)
RDW: 13.8 % (ref 11.5–15.5)
WBC: 6.9 10*3/uL (ref 4.0–10.5)

## 2015-04-26 LAB — BASIC METABOLIC PANEL
BUN: 20 mg/dL (ref 6–23)
CHLORIDE: 105 meq/L (ref 96–112)
CO2: 28 mEq/L (ref 19–32)
Calcium: 10 mg/dL (ref 8.4–10.5)
Creatinine, Ser: 0.68 mg/dL (ref 0.40–1.20)
GFR: 96.96 mL/min (ref >60.00)
GLUCOSE: 90 mg/dL (ref 70–99)
POTASSIUM: 4.9 meq/L (ref 3.5–5.1)
SODIUM: 140 meq/L (ref 135–145)

## 2015-04-26 NOTE — Assessment & Plan Note (Signed)
Continue healthy diet, exercise.  Obtain routine labs. Pap up to date, refer for mammo/colo.  Immunizations reviewed and up to date.

## 2015-04-26 NOTE — Progress Notes (Signed)
Subjective:    Patient ID: Nicole Schneider, female    DOB: 1965-06-22, 49 y.o.   MRN: [id number]  HPI  Ms. Nicole Schneider is a 49 yr old female who presents today for cpx.  Immunizations: up to date Diet: healthy Exercise: walking Colonoscopy: due Pap Smear:2014 ( Neg with neg high risk HPV) LMP 3 months ago Mammogram:04/27/14     Review of Systems  Constitutional: Negative for unexpected weight change.  HENT: Negative for rhinorrhea.   Respiratory: Negative for cough.   Cardiovascular: Negative for chest pain.  Gastrointestinal: Negative for diarrhea and constipation.  Genitourinary: Negative for dysuria and frequency.  Musculoskeletal:       Some arthritis pain, knees/elbowss  Skin: Negative for rash.  Neurological:       Occasional HA's  Hematological: Negative for adenopathy.  Psychiatric/Behavioral:       Denies depression   Past Medical History  Diagnosis Date  . Iron deficiency anemia     Social History   Social History  . Marital Status: Married    Spouse Name: N/A  . Number of Children: N/A  . Years of Education: N/A   Occupational History  . Not on file.   Social History Main Topics  . Smoking status: Never Smoker   . Smokeless tobacco: Not on file  . Alcohol Use: No  . Drug Use: No  . Sexual Activity: Not on file   Other Topics Concern  . Not on file   Social History Narrative   Works in [place]   Married   2 sons.   Grew up in [place].           History reviewed. No pertinent past surgical history.  History reviewed. No pertinent family history.  No Known Allergies  Current Outpatient Prescriptions on File Prior to Visit  Medication Sig Dispense Refill  . ferrous sulfate 325 (65 FE) MG tablet Take 325 mg by mouth 2 (two) times daily.      . Multiple Vitamins-Minerals (ONE DAILY MULTIVITAMIN WOMEN PO) Take 1 tablet by mouth daily.    . Omega-3 Fatty Acids (FISH OIL) 1000 MG CPDR Take 1 capsule by mouth daily.       No current  facility-administered medications on file prior to visit.    Pulse 63  Temp(Src) 97.9 F (36.6 C) (Oral)  Resp 16  Ht 4' 11.5" (1.511 m)  Wt 107 lb 9.6 oz (48.807 kg)  BMI 21.38 kg/m2  SpO2 100%       Objective:   Physical Exam Physical Exam  Constitutional: She is oriented to person, place, and time. She appears well-developed and well-nourished. No distress.  HENT:  Head: Normocephalic and atraumatic.  Right Ear: Tympanic membrane and ear canal normal.  Left Ear: Tympanic membrane and ear canal normal.  Mouth/Throat: Oropharynx is clear and moist.  Eyes: Pupils are equal, round, and reactive to light. No scleral icterus.  Neck: Normal range of motion. No thyromegaly present.  Cardiovascular: Normal rate and regular rhythm.   No murmur heard. Pulmonary/Chest: Effort normal and breath sounds normal. No respiratory distress. He has no wheezes. She has no rales. She exhibits no tenderness.  Abdominal: Soft. Bowel sounds are normal. He exhibits no distension and no mass. There is no tenderness. There is no rebound and no guarding.  Musculoskeletal: She exhibits no edema.  Lymphadenopathy:    She has no cervical adenopathy.  Neurological: She is alert and oriented to person, place, and time. She exhibits normal muscle tone.  Coordination normal.  Skin: Skin is warm and dry.  Psychiatric: She has a normal mood and affect. Her behavior is normal. Judgment and thought content normal.  Breast: bilateral breasts normal without palpable mass/asymmetry        Assessment & Plan:          Assessment & Plan:  EKG tracing is personally reviewed.  EKG notes NSR.  No acute changes.

## 2015-04-26 NOTE — Progress Notes (Signed)
Pre visit review using our clinic review tool, if applicable. No additional management support is needed unless otherwise documented below in the visit note. 

## 2015-04-26 NOTE — Patient Instructions (Signed)
Please complete lab work prior to leaving. Schedule mammogram on the first floor. You will be contacted about scheduling your colonoscopy.  

## 2015-04-28 ENCOUNTER — Telehealth: Payer: Self-pay | Admitting: Family

## 2015-04-28 ENCOUNTER — Encounter: Payer: Self-pay | Admitting: Family

## 2015-04-28 ENCOUNTER — Encounter: Payer: Self-pay | Admitting: Gastroenterology

## 2015-04-28 DIAGNOSIS — D649 Anemia, unspecified: Secondary | ICD-10-CM

## 2015-04-28 NOTE — Telephone Encounter (Signed)
  Cholesterol is mildly elevated.  Please continue to work on low fat/low cholesterol diet/exercise. Pt is still  are mildly anemic.I would like her to complete IFOB as soon as possible and make sure to follow through with upcoming colonoscopy.

## 2015-04-29 NOTE — Telephone Encounter (Signed)
Left message for pt to return my call.

## 2015-05-03 ENCOUNTER — Ambulatory Visit (HOSPITAL_BASED_OUTPATIENT_CLINIC_OR_DEPARTMENT_OTHER)
Admission: RE | Admit: 2015-05-03 | Discharge: 2015-05-03 | Disposition: A | Discharge status: Home / Self Care | Payer: BLUE CROSS/BLUE SHIELD | Source: Ambulatory Visit | Attending: Family | Admitting: Family

## 2015-05-03 DIAGNOSIS — Z0000 Encounter for general adult medical examination without abnormal findings: Secondary | ICD-10-CM | POA: Diagnosis not present

## 2015-05-03 DIAGNOSIS — Z1231 Encounter for screening mammogram for malignant neoplasm of breast: Secondary | ICD-10-CM | POA: Insufficient documentation

## 2015-05-10 NOTE — Telephone Encounter (Signed)
Left message for pt to return my call.

## 2015-05-12 NOTE — Telephone Encounter (Signed)
Son called for patient Nicole Schneider please call him back to discuss the natural for the call [phone] DPR on file I informed the patient the provider will return tomorrow

## 2015-05-13 NOTE — Telephone Encounter (Signed)
Left message for Nicole Schneider to return my call.

## 2015-05-16 NOTE — Telephone Encounter (Signed)
Notified pt's son and he voices understanding and requests that we place copy of test results with the IFOB kit and she will pick it up this week. Order entered and test/labs placed at front desk for pick up.

## 2015-06-10 ENCOUNTER — Other Ambulatory Visit (INDEPENDENT_AMBULATORY_CARE_PROVIDER_SITE_OTHER): Payer: BLUE CROSS/BLUE SHIELD

## 2015-06-10 ENCOUNTER — Encounter: Payer: Self-pay | Admitting: Family

## 2015-06-10 DIAGNOSIS — D649 Anemia, unspecified: Secondary | ICD-10-CM

## 2015-06-10 LAB — FECAL OCCULT BLOOD, IMMUNOCHEMICAL: Fecal Occult Bld: NEGATIVE

## 2015-06-16 ENCOUNTER — Ambulatory Visit (AMBULATORY_SURGERY_CENTER): Payer: Self-pay

## 2015-06-16 VITALS — Ht 60.0 in | Wt 112.8 lb

## 2015-06-16 DIAGNOSIS — Z1211 Encounter for screening for malignant neoplasm of colon: Secondary | ICD-10-CM

## 2015-06-16 NOTE — Progress Notes (Signed)
No exposure to anesthesia No allergies to eggs or soy No home oxygen No diet/weight loss meds  Has email and internet; registered emmi

## 2015-06-30 ENCOUNTER — Ambulatory Visit (AMBULATORY_SURGERY_CENTER): Payer: BLUE CROSS/BLUE SHIELD | Admitting: Gastroenterology

## 2015-06-30 ENCOUNTER — Encounter: Payer: Self-pay | Admitting: Gastroenterology

## 2015-06-30 VITALS — BP 128/71 | HR 66 | Temp 98.4°F | Resp 12 | Ht 60.0 in | Wt 112.0 lb

## 2015-06-30 DIAGNOSIS — Z1211 Encounter for screening for malignant neoplasm of colon: Secondary | ICD-10-CM

## 2015-06-30 DIAGNOSIS — D122 Benign neoplasm of ascending colon: Secondary | ICD-10-CM

## 2015-06-30 MED ORDER — SODIUM CHLORIDE 0.9 % IV SOLN: 500.0000 mL | INTRAVENOUS | Status: DC

## 2015-06-30 NOTE — Progress Notes (Signed)
To pacu vss patent aw report to rn 

## 2015-06-30 NOTE — Op Note (Signed)
[place]  [address]. [place], [address]   COLONOSCOPY PROCEDURE REPORT  PATIENT: Nicole Schneider, Nicole Schneider  MR#: [id number] BIRTHDATE: 24-Sep-1965 , 49  yrs. old GENDER: female ENDOSCOPIST: Ladene Artist, MD, [facility] REFERRED BY: [name] NP PROCEDURE DATE:  06/30/2015 PROCEDURE:   Colonoscopy, screening and Colonoscopy with biopsy First Screening Colonoscopy - Avg.  risk and is 50 yrs.  old or older Yes.  Prior Negative Screening - Now for repeat screening. N/A  History of Adenoma - Now for follow-up colonoscopy & has been > or = to 3 yrs.  N/A  Polyps removed today? Yes ASA CLASS:   Class II INDICATIONS:Screening for colonic neoplasia and Colorectal Neoplasm Risk Assessment for this procedure is average risk. MEDICATIONS: Monitored anesthesia care and Propofol 200 mg IV  DESCRIPTION OF PROCEDURE:   After the risks benefits and alternatives of the procedure were thoroughly explained, informed consent was obtained.  The digital rectal exam revealed no abnormalities of the rectum.   The LB [id number]-[id number]  endoscope was introduced through the anus and advanced to the cecum, which was identified by both the appendix and ileocecal valve. No adverse events experienced.   The quality of the prep was excellent. (Suprep was used)  The instrument was then slowly withdrawn as the colon was fully examined. Estimated blood loss is zero unless otherwise noted in this procedure report.   COLON FINDINGS: A sessile polyp measuring 4 mm in size was found in the ascending colon.  A polypectomy was performed with cold forceps.  The resection was complete, the polyp tissue was completely retrieved and sent to histology.   The colonic mucosa appeared normal in the descending colon, at the splenic flexure, in the rectum, sigmoid colon, at the ileocecal valve, cecum, in the transverse colon, and at the hepatic flexure.  Retroflexed views revealed no abnormalities. The time to  cecum = 1.8 Withdrawal time = 9.9   The scope was withdrawn and the procedure completed. COMPLICATIONS: There were no immediate complications.  ENDOSCOPIC IMPRESSION: 1.   Sessile polyp in the ascending colon; polypectomy performed with cold forceps 2.   The colonic mucosa otherwise appeared normal  RECOMMENDATIONS: 1.  Await pathology results 2.  Repeat colonoscopy in 5 years if polyp adenomatous; otherwise 10 years  eSigned:  Ladene Artist, MD, [facility] 06/30/2015 10:56 AM  [C

## 2015-06-30 NOTE — Patient Instructions (Signed)
YOU HAD AN ENDOSCOPIC PROCEDURE TODAY AT THE [facility]:   Refer to the procedure report that was given to you for any specific questions about what was found during the examination.  If the procedure report does not answer your questions, please call your gastroenterologist to clarify.  If you requested that your care partner not be given the details of your procedure findings, then the procedure report has been included in a sealed envelope for you to review at your convenience later.  YOU SHOULD EXPECT: Some feelings of bloating in the abdomen. Passage of more gas than usual.  Walking can help get rid of the air that was put into your GI tract during the procedure and reduce the bloating. If you had a lower endoscopy (such as a colonoscopy or flexible sigmoidoscopy) you may notice spotting of blood in your stool or on the toilet paper. If you underwent a bowel prep for your procedure, you may not have a normal bowel movement for a few days.  Please Note:  You might notice some irritation and congestion in your nose or some drainage.  This is from the oxygen used during your procedure.  There is no need for concern and it should clear up in a day or so.  SYMPTOMS TO REPORT IMMEDIATELY:   Following lower endoscopy (colonoscopy or flexible sigmoidoscopy):  Excessive amounts of blood in the stool  Significant tenderness or worsening of abdominal pains  Swelling of the abdomen that is new, acute  Fever of 100F or higher   For urgent or emergent issues, a gastroenterologist can be reached at any hour by calling [phone].   DIET: Your first meal following the procedure should be a small meal and then it is ok to progress to your normal diet. Heavy or fried foods are harder to digest and may make you feel nauseous or bloated.  Likewise, meals heavy in dairy and vegetables can increase bloating.  Drink plenty of fluids but you should avoid alcoholic beverages for 24  hours.  ACTIVITY:  You should plan to take it easy for the rest of today and you should NOT DRIVE or use heavy machinery until tomorrow (because of the sedation medicines used during the test).    FOLLOW UP: Our staff will call the number listed on your records the next business day following your procedure to check on you and address any questions or concerns that you may have regarding the information given to you following your procedure. If we do not reach you, we will leave a message.  However, if you are feeling well and you are not experiencing any problems, there is no need to return our call.  We will assume that you have returned to your regular daily activities without incident.  If any biopsies were taken you will be contacted by phone or by letter within the next 1-3 weeks.  Please call us at [phone] if you have not heard about the biopsies in 3 weeks.    SIGNATURES/CONFIDENTIALITY: You and/or your care partner have signed paperwork which will be entered into your electronic medical record.  These signatures attest to the fact that that the information above on your After Visit Summary has been reviewed and is understood.  Full responsibility of the confidentiality of this discharge information lies with you and/or your care-partner.  Polyp-handout given  Repeat colonoscopy will be determined by pathology.   

## 2015-06-30 NOTE — Progress Notes (Signed)
intrepreter at bedside.

## 2015-06-30 NOTE — Progress Notes (Signed)
Called to room to assist during endoscopic procedure.  Patient ID and intended procedure confirmed with present staff. Received instructions for my participation in the procedure from the performing physician.  

## 2015-07-01 ENCOUNTER — Telehealth: Payer: Self-pay | Admitting: *Deleted

## 2015-07-01 ENCOUNTER — Telehealth: Payer: Self-pay | Admitting: Gastroenterology

## 2015-07-01 NOTE — Telephone Encounter (Signed)
I spoke with the patient's son. Patient had a colonoscopy yesterday with ascending colon polypectomy with cold forceps.    Patient reports abdominal pain , mild, mid-abdomen that started last night. "Not that bad".  She reports that she is passing gas and eating a soft diet.  She denies rectal bleeding, nausea, or vomiting, or other complaints.  Patient is advised to try room temperature liquids, rest, and remain on a soft diet.  She is advised to call back or report to ER for evaluation for worsening symptoms ie. severe abdominal pain, rectal bleeding independent of stool.  Son and the patient verbalized understanding.    Dr. Carlean Purl you are MD of the day.  Please review and advise.

## 2015-07-01 NOTE — Telephone Encounter (Signed)
  Follow up Call-  Call back number 06/30/2015  Post procedure Call Back phone  # [phone]  Permission to leave phone message Yes    Spoke with son Patient questions:  Do you have a fever, pain , or abdominal swelling? No. Pain Score  0 *  Have you tolerated food without any problems? Yes.    Have you been able to return to your normal activities? Yes.    Do you have any questions about your discharge instructions: Diet   No. Medications  No. Follow up visit  No.  Do you have questions or concerns about your Care? No.  Actions: * If pain score is 4 or above: No action needed, pain <4.

## 2015-07-01 NOTE — Telephone Encounter (Signed)
I agree with RN recommendations.

## 2015-07-05 ENCOUNTER — Encounter: Payer: Self-pay | Admitting: Gastroenterology

## 2016-05-09 ENCOUNTER — Other Ambulatory Visit (HOSPITAL_COMMUNITY)
Admission: RE | Admit: 2016-05-09 | Discharge: 2016-05-09 | Disposition: A | Discharge status: Home / Self Care | Payer: BLUE CROSS/BLUE SHIELD | Source: Ambulatory Visit | Attending: Family | Admitting: Family

## 2016-05-09 ENCOUNTER — Ambulatory Visit (INDEPENDENT_AMBULATORY_CARE_PROVIDER_SITE_OTHER): Payer: BLUE CROSS/BLUE SHIELD | Admitting: Family

## 2016-05-09 ENCOUNTER — Encounter: Payer: Self-pay | Admitting: Family

## 2016-05-09 ENCOUNTER — Other Ambulatory Visit: Payer: BLUE CROSS/BLUE SHIELD

## 2016-05-09 VITALS — BP 121/78 | HR 71 | Temp 98.3°F | Resp 16 | Ht 60.0 in | Wt 103.0 lb

## 2016-05-09 DIAGNOSIS — Z01419 Encounter for gynecological examination (general) (routine) without abnormal findings: Secondary | ICD-10-CM | POA: Diagnosis not present

## 2016-05-09 DIAGNOSIS — Z1151 Encounter for screening for human papillomavirus (HPV): Secondary | ICD-10-CM | POA: Diagnosis present

## 2016-05-09 DIAGNOSIS — Z0000 Encounter for general adult medical examination without abnormal findings: Secondary | ICD-10-CM

## 2016-05-09 DIAGNOSIS — D729 Disorder of white blood cells, unspecified: Secondary | ICD-10-CM

## 2016-05-09 LAB — CBC WITH DIFFERENTIAL/PLATELET
Basophils Relative: 0 % (ref 0.0–3.0)
Eosinophils Relative: 6 % — ABNORMAL HIGH (ref 0.0–5.0)
HCT: 36.1 % (ref 36.0–46.0)
HEMOGLOBIN: 12.1 g/dL (ref 12.0–15.0)
LYMPHS PCT: 33 % (ref 12.0–46.0)
MCHC: 33.5 g/dL (ref 30.0–36.0)
MCV: 77.1 fl — AB (ref 78.0–100.0)
Monocytes Relative: 6 % (ref 3.0–12.0)
Neutrophils Relative %: 55 % (ref 43.0–77.0)
Platelets: 246 10*3/uL (ref 150.0–400.0)
RBC: 4.68 Mil/uL (ref 3.87–5.11)
RDW: 13.8 % (ref 11.5–15.5)
WBC: 6.2 10*3/uL (ref 4.0–10.5)

## 2016-05-09 LAB — LIPID PANEL
CHOLESTEROL: 190 mg/dL (ref 0–200)
HDL: 64.3 mg/dL (ref >39.00)
LDL Cholesterol: 114 mg/dL — ABNORMAL HIGH (ref 0–99)
NonHDL: 126.12
TRIGLYCERIDES: 63 mg/dL (ref 0.0–149.0)
Total CHOL/HDL Ratio: 3
VLDL: 12.6 mg/dL (ref 0.0–40.0)

## 2016-05-09 LAB — URINALYSIS, ROUTINE W REFLEX MICROSCOPIC
BILIRUBIN URINE: NEGATIVE
HGB URINE DIPSTICK: NEGATIVE
Ketones, ur: NEGATIVE
LEUKOCYTES UA: NEGATIVE
NITRITE: NEGATIVE
RBC / HPF: NONE SEEN (ref 0–?)
Specific Gravity, Urine: 1.015 (ref 1.000–1.030)
TOTAL PROTEIN, URINE-UPE24: NEGATIVE
UROBILINOGEN UA: 0.2 (ref 0.0–1.0)
Urine Glucose: NEGATIVE
WBC UA: NONE SEEN (ref 0–?)
pH: 7.5 (ref 5.0–8.0)

## 2016-05-09 LAB — BASIC METABOLIC PANEL
BUN: 10 mg/dL (ref 6–23)
CALCIUM: 9.8 mg/dL (ref 8.4–10.5)
CO2: 31 meq/L (ref 19–32)
CREATININE: 0.83 mg/dL (ref 0.40–1.20)
Chloride: 105 mEq/L (ref 96–112)
GFR: 76.73 mL/min (ref >60.00)
GLUCOSE: 96 mg/dL (ref 70–99)
Potassium: 4.8 mEq/L (ref 3.5–5.1)
Sodium: 141 mEq/L (ref 135–145)

## 2016-05-09 LAB — HEPATIC FUNCTION PANEL
ALBUMIN: 4.6 g/dL (ref 3.5–5.2)
ALT: 19 U/L (ref 0–35)
AST: 17 U/L (ref 0–37)
Alkaline Phosphatase: 44 U/L (ref 39–117)
Bilirubin, Direct: 0.1 mg/dL (ref 0.0–0.3)
TOTAL PROTEIN: 7.3 g/dL (ref 6.0–8.3)
Total Bilirubin: 0.7 mg/dL (ref 0.2–1.2)

## 2016-05-09 LAB — TSH: TSH: 0.74 u[IU]/mL (ref 0.35–4.50)

## 2016-05-09 NOTE — Patient Instructions (Signed)
Complete lab work prior to leaving. Call if you have any further weight loss.

## 2016-05-09 NOTE — Progress Notes (Signed)
Subjective:    Patient ID: Nicole Schneider, female    DOB: July 02, 1965, 51 y.o.   MRN: [id number]  HPI  Patient presents today for complete physical.  Immunizations: up to date Diet: reports that her diet is fair Exercise: walks 3 days a week Colonoscopy: 06/30/15 Pap Smear: due Mammogram: due  Reports that she had a 1 month hx of anorexia which resolved. Now appetite has returned.   Wt Readings from Last 3 Encounters:  05/09/16 103 lb (46.7 kg)  06/30/15 112 lb (50.8 kg)  06/16/15 112 lb 12.8 oz (51.2 kg)     Review of Systems  Constitutional: Positive for unexpected weight change.  HENT: Negative for hearing loss and rhinorrhea.   Eyes: Negative for visual disturbance.  Respiratory: Negative for cough.   Cardiovascular: Negative for leg swelling.  Gastrointestinal: Negative for abdominal pain.  Genitourinary: Negative for dysuria.  Musculoskeletal:       Occasional knee pain  Neurological:       Occasional headaches  Hematological: Negative for adenopathy.  Psychiatric/Behavioral:       Denies anxiety or depression   Past Medical History:  Diagnosis Date  . Allergy   . Iron deficiency anemia   . Seasonal allergies      Social History   Social History  . Marital status: Married    Spouse name: N/A  . Number of children: N/A  . Years of education: N/A   Occupational History  . Not on file.   Social History Main Topics  . Smoking status: Never Smoker  . Smokeless tobacco: Never Used  . Alcohol use No  . Drug use: No  . Sexual activity: Not on file   Other Topics Concern  . Not on file   Social History Narrative   Works in [place]   Married   2 sons.   Grew up in [place].           Past Surgical History:  Procedure Laterality Date  . miscarriage      Family History  Problem Relation Age of Onset  . Colon cancer Neg Hx     No Known Allergies  Current Outpatient Prescriptions on File Prior to Visit  Medication Sig Dispense Refill  .  ferrous sulfate 325 (65 FE) MG tablet Take 325 mg by mouth 2 (two) times daily.      . Multiple Vitamins-Minerals (ONE DAILY MULTIVITAMIN WOMEN PO) Take 1 tablet by mouth daily.    . Omega-3 Fatty Acids (FISH OIL) 1000 MG CPDR Take 1 capsule by mouth daily.       No current facility-administered medications on file prior to visit.     BP 121/78 (BP Location: Right Arm, Cuff Size: Normal)   Pulse 71   Temp 98.3 F (36.8 C) (Oral)   Resp 16   Ht 5' (1.524 m)   Wt 103 lb (46.7 kg)   SpO2 100% Comment: right arm  BMI 20.12 kg/m       Objective:   Physical Exam  Physical Exam  Constitutional: She is oriented to person, place, and time. She appears well-developed and well-nourished. No distress.  HENT:  Head: Normocephalic and atraumatic.  Right Ear: Tympanic membrane and ear canal normal.  Left Ear: Tympanic membrane and ear canal normal.  Mouth/Throat: Oropharynx is clear and moist.  Eyes: Pupils are equal, round, and reactive to light. No scleral icterus.  Neck: Normal range of motion. No thyromegaly present.  Cardiovascular: Normal rate and regular rhythm.  No murmur heard. Pulmonary/Chest: Effort normal and breath sounds normal. No respiratory distress. He has no wheezes. She has no rales. She exhibits no tenderness.  Abdominal: Soft. Bowel sounds are normal. She exhibits no distension and no mass. There is no tenderness. There is no rebound and no guarding.  Musculoskeletal: She exhibits no edema.  Lymphadenopathy:    She has no cervical adenopathy.  Neurological: She is alert and oriented to person, place, and time. She has normal patellar reflexes. She exhibits normal muscle tone. Coordination normal.  Skin: Skin is warm and dry.  Psychiatric: She has a normal mood and affect. Her behavior is normal. Judgment and thought content normal.  Breasts: Examined lying Right: Without masses, retractions, discharge or axillary adenopathy.  Left: Without masses, retractions,  discharge or axillary adenopathy.  Inguinal/mons: Normal without inguinal adenopathy  External genitalia: Normal  BUS/Urethra/Skene's glands: Normal  Bladder: Normal  Vagina: Normal  Cervix: Normal  Uterus: normal in size, shape and contour. Midline and mobile  Adnexa/parametria:  Rt: Without masses or tenderness.  Lt: Without masses or tenderness.  Anus and perineum: Normal            Assessment & Plan:   Preventative care- immunizations reviewed and up to date. Pap performed. Will obtain lab work and mammogram. EKG tracing is personally reviewed.  EKG notes NSR.  No acute changes.        Assessment & Plan:

## 2016-05-09 NOTE — Addendum Note (Signed)
Addended by: Kelle Darting A on: 05/09/2016 02:52 PM   Modules accepted: Orders

## 2016-05-09 NOTE — Progress Notes (Signed)
Pre visit review using our clinic review tool, if applicable. No additional management support is needed unless otherwise documented below in the visit note. 

## 2016-05-10 ENCOUNTER — Encounter: Payer: Self-pay | Admitting: Family

## 2016-05-10 LAB — PATHOLOGIST SMEAR REVIEW

## 2016-05-11 LAB — CYTOLOGY - PAP
Adequacy: ABSENT
DIAGNOSIS: NEGATIVE
HPV (WINDOPATH): NOT DETECTED

## 2016-05-15 ENCOUNTER — Ambulatory Visit (HOSPITAL_BASED_OUTPATIENT_CLINIC_OR_DEPARTMENT_OTHER)
Admission: RE | Admit: 2016-05-15 | Discharge: 2016-05-15 | Disposition: A | Discharge status: Home / Self Care | Payer: BLUE CROSS/BLUE SHIELD | Source: Ambulatory Visit | Attending: Family | Admitting: Family

## 2016-05-15 DIAGNOSIS — Z1231 Encounter for screening mammogram for malignant neoplasm of breast: Secondary | ICD-10-CM | POA: Diagnosis present

## 2016-05-15 DIAGNOSIS — Z0000 Encounter for general adult medical examination without abnormal findings: Secondary | ICD-10-CM

## 2016-08-06 ENCOUNTER — Ambulatory Visit: Payer: BLUE CROSS/BLUE SHIELD | Admitting: Family

## 2016-08-15 ENCOUNTER — Ambulatory Visit (INDEPENDENT_AMBULATORY_CARE_PROVIDER_SITE_OTHER): Payer: BLUE CROSS/BLUE SHIELD | Admitting: Family

## 2016-08-15 ENCOUNTER — Encounter: Payer: Self-pay | Admitting: Family

## 2016-08-15 ENCOUNTER — Ambulatory Visit: Payer: BLUE CROSS/BLUE SHIELD | Admitting: Family

## 2016-08-15 VITALS — BP 125/69 | HR 71 | Temp 98.5°F | Resp 16 | Ht 60.0 in | Wt 101.8 lb

## 2016-08-15 DIAGNOSIS — E611 Iron deficiency: Secondary | ICD-10-CM | POA: Diagnosis not present

## 2016-08-15 DIAGNOSIS — D509 Iron deficiency anemia, unspecified: Secondary | ICD-10-CM | POA: Diagnosis not present

## 2016-08-15 DIAGNOSIS — Z0000 Encounter for general adult medical examination without abnormal findings: Secondary | ICD-10-CM

## 2016-08-15 LAB — CBC WITH DIFFERENTIAL/PLATELET
BASOS ABS: 0 10*3/uL (ref 0.0–0.1)
Basophils Relative: 0.3 % (ref 0.0–3.0)
EOS ABS: 0.1 10*3/uL (ref 0.0–0.7)
Eosinophils Relative: 1.4 % (ref 0.0–5.0)
HCT: 34.8 % — ABNORMAL LOW (ref 36.0–46.0)
Hemoglobin: 11.3 g/dL — ABNORMAL LOW (ref 12.0–15.0)
LYMPHS PCT: 28.4 % (ref 12.0–46.0)
Lymphs Abs: 1.9 10*3/uL (ref 0.7–4.0)
MCHC: 32.6 g/dL (ref 30.0–36.0)
MCV: 78.7 fl (ref 78.0–100.0)
MONO ABS: 0.5 10*3/uL (ref 0.1–1.0)
Monocytes Relative: 7.1 % (ref 3.0–12.0)
NEUTROS ABS: 4.3 10*3/uL (ref 1.4–7.7)
Neutrophils Relative %: 62.8 % (ref 43.0–77.0)
PLATELETS: 221 10*3/uL (ref 150.0–400.0)
RBC: 4.43 Mil/uL (ref 3.87–5.11)
RDW: 14 % (ref 11.5–15.5)
WBC: 6.8 10*3/uL (ref 4.0–10.5)

## 2016-08-15 LAB — IRON: IRON: 78 ug/dL (ref 42–145)

## 2016-08-15 NOTE — Progress Notes (Signed)
Pre visit review using our clinic review tool, if applicable. No additional management support is needed unless otherwise documented below in the visit note. 

## 2016-08-15 NOTE — Patient Instructions (Signed)
Please complete lab work prior to leaving. For headache you can try ibuprofen (motrin) 400mg  available over the counter as needed. Call if headache worsens or if it does not improve.

## 2016-08-15 NOTE — Progress Notes (Signed)
Subjective:    Patient ID: Nicole Schneider, female    DOB: 1966-01-06, 51 y.o.   MRN: [id number]  HPI   Ms. Nicole Schneider is a 51 yr old female who presents today for follow up.  1) Anemia- She continues iron tablet once daily.   Lab Results  Component Value Date   WBC 6.2 05/09/2016   HGB 12.1 05/09/2016   HCT 36.1 05/09/2016   MCV 77.1 (L) 05/09/2016   PLT 246.0 05/09/2016   2) Headache- reports 2 weeks ago she had HA x 1 week. Then intermittent. Has HA today. She describes pain as moderate. Non throbbing in nature.  Denies nausea or photophobia or phonophobia.   Review of Systems See HPI  Past Medical History:  Diagnosis Date  . Allergy   . Iron deficiency anemia   . Seasonal allergies      Social History   Social History  . Marital status: Married    Spouse name: N/A  . Number of children: N/A  . Years of education: N/A   Occupational History  . Not on file.   Social History Main Topics  . Smoking status: Never Smoker  . Smokeless tobacco: Never Used  . Alcohol use No  . Drug use: No  . Sexual activity: Not on file   Other Topics Concern  . Not on file   Social History Narrative   Works in [place]   Married   2 sons.   Grew up in [place].           Past Surgical History:  Procedure Laterality Date  . miscarriage      Family History  Problem Relation Age of Onset  . Colon cancer Neg Hx     No Known Allergies  Current Outpatient Prescriptions on File Prior to Visit  Medication Sig Dispense Refill  . ferrous sulfate 325 (65 FE) MG tablet Take 325 mg by mouth 2 (two) times daily.      . Multiple Vitamins-Minerals (ONE DAILY MULTIVITAMIN WOMEN PO) Take 1 tablet by mouth daily.    . Omega-3 Fatty Acids (FISH OIL) 1000 MG CPDR Take 1 capsule by mouth daily.       No current facility-administered medications on file prior to visit.     BP 125/69 (BP Location: Right Arm, Cuff Size: Normal)   Pulse 71   Temp 98.5 F (36.9 C) (Oral)   Resp 16   Ht 5'  (1.524 m)   Wt 101 lb 12.8 oz (46.2 kg)   SpO2 100% Comment: room air  BMI 19.88 kg/m       Objective:   Physical Exam  Constitutional: She is oriented to person, place, and time. She appears well-developed and well-nourished.  HENT:  Right Ear: Tympanic membrane and ear canal normal.  Left Ear: Tympanic membrane and ear canal normal.  Eyes: Pupils are equal, round, and reactive to light.  Cardiovascular: Normal rate, regular rhythm and normal heart sounds.   No murmur heard. Pulmonary/Chest: Effort normal and breath sounds normal. No respiratory distress. She has no wheezes.  Neurological: She is alert and oriented to person, place, and time.  Skin: Skin is warm and dry.  Psychiatric: She has a normal mood and affect. Her behavior is normal. Judgment and thought content normal.          Assessment & Plan:  Headaches- intermittent- new. advised pt on trial of prn ibuprofen, call if new/worsening symptoms or if symptoms do not improve. Pt verbalizes understanding.

## 2016-08-16 ENCOUNTER — Encounter: Payer: Self-pay | Admitting: Family

## 2016-08-16 NOTE — Assessment & Plan Note (Signed)
Follow up iron level WNL. Continue iron supplement. She is still mildly anemic.  She is post menopausal and has had neg IFOB and is up to date on colonoscopy.

## 2017-06-04 ENCOUNTER — Telehealth: Payer: Self-pay | Admitting: Family

## 2017-06-04 NOTE — Telephone Encounter (Signed)
Copied from [place] [id number]. Topic: Quick Communication - See Telephone Encounter >> Jun 04, 2017 10:11 AM Corie Chiquito, NT wrote: CRM for notification. See Telephone encounter for: Patient has a physical scheduled for 06-05-2017 @ 9am, needs fasting labs but there isn't an order.If someone could give her a call back about lab appointment at [phone]. Patient needs an interpreter as well when she comes for physical

## 2017-06-04 NOTE — Telephone Encounter (Signed)
Nicole Schneider-- can you contact interpreter for pt's appt tomorrow at [place] with Melissa?  Notified pt to be fasting tomorrow morning and labs will be drawn after her appointment. She reports that we usually arrange for an interpreter for her appts. Interpreter will be contacted.

## 2017-06-05 ENCOUNTER — Ambulatory Visit (INDEPENDENT_AMBULATORY_CARE_PROVIDER_SITE_OTHER): Payer: Managed Care, Other (non HMO) | Admitting: Family

## 2017-06-05 ENCOUNTER — Encounter: Payer: Self-pay | Admitting: Family

## 2017-06-05 ENCOUNTER — Telehealth: Payer: Self-pay | Admitting: Family

## 2017-06-05 VITALS — BP 132/84 | HR 107 | Temp 98.4°F | Resp 16 | Ht 60.0 in | Wt 107.0 lb

## 2017-06-05 DIAGNOSIS — D509 Iron deficiency anemia, unspecified: Secondary | ICD-10-CM | POA: Diagnosis not present

## 2017-06-05 DIAGNOSIS — E348 Other specified endocrine disorders: Secondary | ICD-10-CM

## 2017-06-05 DIAGNOSIS — Z0000 Encounter for general adult medical examination without abnormal findings: Secondary | ICD-10-CM

## 2017-06-05 DIAGNOSIS — E875 Hyperkalemia: Secondary | ICD-10-CM

## 2017-06-05 DIAGNOSIS — Z0001 Encounter for general adult medical examination with abnormal findings: Secondary | ICD-10-CM

## 2017-06-05 DIAGNOSIS — J029 Acute pharyngitis, unspecified: Secondary | ICD-10-CM | POA: Diagnosis not present

## 2017-06-05 LAB — HEPATIC FUNCTION PANEL
ALT: 14 U/L (ref 0–35)
AST: 15 U/L (ref 0–37)
Albumin: 4.4 g/dL (ref 3.5–5.2)
Alkaline Phosphatase: 67 U/L (ref 39–117)
BILIRUBIN TOTAL: 0.8 mg/dL (ref 0.2–1.2)
Bilirubin, Direct: 0.1 mg/dL (ref 0.0–0.3)
TOTAL PROTEIN: 8.1 g/dL (ref 6.0–8.3)

## 2017-06-05 LAB — BASIC METABOLIC PANEL
BUN: 14 mg/dL (ref 6–23)
CALCIUM: 10.1 mg/dL (ref 8.4–10.5)
CHLORIDE: 105 meq/L (ref 96–112)
CO2: 30 mEq/L (ref 19–32)
CREATININE: 0.74 mg/dL (ref 0.40–1.20)
GFR: 87.23 mL/min (ref >60.00)
Glucose, Bld: 101 mg/dL — ABNORMAL HIGH (ref 70–99)
Potassium: 5.5 mEq/L — ABNORMAL HIGH (ref 3.5–5.1)
Sodium: 143 mEq/L (ref 135–145)

## 2017-06-05 LAB — CBC WITH DIFFERENTIAL/PLATELET
BASOS PCT: 0.3 % (ref 0.0–3.0)
Basophils Absolute: 0 10*3/uL (ref 0.0–0.1)
EOS ABS: 3.2 10*3/uL — AB (ref 0.0–0.7)
Eosinophils Relative: 28.4 % — ABNORMAL HIGH (ref 0.0–5.0)
HEMATOCRIT: 37.8 % (ref 36.0–46.0)
HEMOGLOBIN: 12 g/dL (ref 12.0–15.0)
LYMPHS PCT: 19 % (ref 12.0–46.0)
Lymphs Abs: 2.1 10*3/uL (ref 0.7–4.0)
MCHC: 31.8 g/dL (ref 30.0–36.0)
MCV: 78.7 fl (ref 78.0–100.0)
MONOS PCT: 6.3 % (ref 3.0–12.0)
Monocytes Absolute: 0.7 10*3/uL (ref 0.1–1.0)
Neutro Abs: 5.1 10*3/uL (ref 1.4–7.7)
Neutrophils Relative %: 46 % (ref 43.0–77.0)
Platelets: 282 10*3/uL (ref 150.0–400.0)
RBC: 4.8 Mil/uL (ref 3.87–5.11)
RDW: 13.6 % (ref 11.5–15.5)
WBC: 11.1 10*3/uL — ABNORMAL HIGH (ref 4.0–10.5)

## 2017-06-05 LAB — URINALYSIS, ROUTINE W REFLEX MICROSCOPIC
Bilirubin Urine: NEGATIVE
Ketones, ur: NEGATIVE
Leukocytes, UA: NEGATIVE
Nitrite: NEGATIVE
RBC / HPF: NONE SEEN (ref 0–?)
SPECIFIC GRAVITY, URINE: 1.025 (ref 1.000–1.030)
Total Protein, Urine: NEGATIVE
Urine Glucose: NEGATIVE
Urobilinogen, UA: 0.2 (ref 0.0–1.0)
WBC UA: NONE SEEN (ref 0–?)
pH: 5.5 (ref 5.0–8.0)

## 2017-06-05 LAB — IRON: Iron: 47 ug/dL (ref 42–145)

## 2017-06-05 LAB — LIPID PANEL
CHOL/HDL RATIO: 4
Cholesterol: 220 mg/dL — ABNORMAL HIGH (ref 0–200)
HDL: 50.9 mg/dL (ref >39.00)
LDL CALC: 152 mg/dL — AB (ref 0–99)
NonHDL: 168.97
TRIGLYCERIDES: 85 mg/dL (ref 0.0–149.0)
VLDL: 17 mg/dL (ref 0.0–40.0)

## 2017-06-05 LAB — FERRITIN: FERRITIN: 126.9 ng/mL (ref 10.0–291.0)

## 2017-06-05 LAB — POCT RAPID STREP A (OFFICE): RAPID STREP A SCREEN: NEGATIVE

## 2017-06-05 LAB — TSH: TSH: 1.18 u[IU]/mL (ref 0.35–4.50)

## 2017-06-05 NOTE — Progress Notes (Signed)
Subjective:    Patient ID: Nicole Schneider, female    DOB: May 20, 1965, 52 y.o.   MRN: [id number]  HPI  Patient presents today for complete physical.  Immunizations: tetanus 2011, would like flu shot today Diet: reports healthy diet Exercise: reports exercise 2-5 times a week.  Colonoscopy: 2/17 Pap Smear: 2018 Mammogram:1/18, due Dental:  Goes once a year Vision: up to date per pt Wt Readings from Last 3 Encounters:  06/05/17 107 lb (48.5 kg)  08/15/16 101 lb 12.8 oz (46.2 kg)  05/09/16 103 lb (46.7 kg)   Burmese Video interpreter is used today.   Reports itchy throat and cough x 3 days. No fever.    Anemia- reports good compliance with iron.    Review of Systems  Constitutional: Negative for unexpected weight change.  HENT: Negative for rhinorrhea.   Respiratory: Negative for cough.   Cardiovascular: Negative for chest pain.       Denies chest pain  Gastrointestinal: Negative for blood in stool, constipation and diarrhea.  Genitourinary: Negative for dysuria and hematuria.  Musculoskeletal: Negative for myalgias.       Reports some pain in the right foot and knee.  Reports that this started "long time ago."  7-8 months ago.  Declines ortho referral.   Skin: Negative for rash.  Neurological: Negative for headaches.  Hematological: Negative for adenopathy.  Psychiatric/Behavioral:       Denies depression/anxiety       Past Medical History:  Diagnosis Date  . Allergy   . Iron deficiency anemia   . Seasonal allergies      Social History   Socioeconomic History  . Marital status: Married    Spouse name: Not on file  . Number of children: Not on file  . Years of education: Not on file  . Highest education level: Not on file  Social Needs  . Financial resource strain: Not on file  . Food insecurity - worry: Not on file  . Food insecurity - inability: Not on file  . Transportation needs - medical: Not on file  . Transportation needs - non-medical: Not on file    Occupational History  . Not on file  Tobacco Use  . Smoking status: Never Smoker  . Smokeless tobacco: Never Used  Substance and Sexual Activity  . Alcohol use: No    Alcohol/week: 0.0 oz  . Drug use: No  . Sexual activity: Not on file  Other Topics Concern  . Not on file  Social History Narrative   Works in [place]   Married   2 sons.   Grew up in [place].        Past Surgical History:  Procedure Laterality Date  . miscarriage      Family History  Problem Relation Age of Onset  . Colon cancer Neg Hx     No Known Allergies  Current Outpatient Medications on File Prior to Visit  Medication Sig Dispense Refill  . ferrous sulfate 325 (65 FE) MG tablet Take 325 mg by mouth 2 (two) times daily.      . Multiple Vitamins-Minerals (ONE DAILY MULTIVITAMIN WOMEN PO) Take 1 tablet by mouth daily.    . Omega-3 Fatty Acids (FISH OIL) 1000 MG CPDR Take 1 capsule by mouth daily.       No current facility-administered medications on file prior to visit.     BP 132/84   Pulse (!) 107   Temp 98.4 F (36.9 C) (Oral)   Resp 16  Ht 5' (1.524 m)   Wt 107 lb (48.5 kg)   SpO2 99%   BMI 20.90 kg/m    Objective:   Physical Exam  Physical Exam  Constitutional: She is oriented to person, place, and time. She appears well-developed and well-nourished. No distress.  HENT:  Head: Normocephalic and atraumatic.  Right Ear: Tympanic membrane and ear canal normal.  Left Ear: Tympanic membrane and ear canal normal.  Mouth/Throat: Oropharynx is clear and moist. + oropharyngeal erythema is noted without edema or exudates Eyes: Pupils are equal, round, and reactive to light. No scleral icterus.  Neck: Normal range of motion. No thyromegaly present.  Cardiovascular: Normal rate and regular rhythm.   No murmur heard. Pulmonary/Chest: Effort normal and breath sounds normal. No respiratory distress. He has no wheezes. She has no rales. She exhibits no tenderness.  Abdominal: Soft. Bowel  sounds are normal. She exhibits no distension and no mass. There is no tenderness. There is no rebound and no guarding.  Musculoskeletal: She exhibits no edema.  Lymphadenopathy:    She has no cervical adenopathy.  Neurological: She is alert and oriented to person, place, and time. She has normal patellar reflexes. She exhibits normal muscle tone. Coordination normal.  Skin: Skin is warm and dry.  Psychiatric: She has a normal mood and affect. Her behavior is normal. Judgment and thought content normal.  Breast/pelvic: deferred           Assessment & Plan:    Preventative Care- encouraged pt to continue healthy diet and exercise.  Will obtain routine lab work. Refer for mammogram and dexa.  Anemia- check serum iron, ferritin, cbc, continue iron supplement.    Viral pharyngitis- rapid strep is negative.  Advised pt on supportive measures and to call if symptoms worsen or if symptoms are not improved in 3-4 days.    Assessment & Plan:

## 2017-06-05 NOTE — Telephone Encounter (Signed)
Potassium is elevated.  I would like to repeat her bmet please dx hyperkalemia.  Iron level looks good. Please continue iron supplement. Thyroid testing is normal.

## 2017-06-05 NOTE — Patient Instructions (Addendum)
Strep test is negative. I think you have a virus.  You may use tylenol or motrin as needed for cough. Let me know if symptoms worsen or if they do not improve in 3-4 days. I would recommend that you get a flu shot at your local pharmacy as soon as you are feeling better. Complete lab work prior to leaving. Schedule bone density and mammogram on the first floor in the imaging department.

## 2017-06-07 NOTE — Telephone Encounter (Signed)
Tried calling patient, no answer and voicemail box was full. Sent patient letter of her results.

## 2017-06-10 ENCOUNTER — Ambulatory Visit (HOSPITAL_BASED_OUTPATIENT_CLINIC_OR_DEPARTMENT_OTHER)
Admission: RE | Admit: 2017-06-10 | Discharge: 2017-06-10 | Disposition: A | Discharge status: Home / Self Care | Payer: Managed Care, Other (non HMO) | Source: Ambulatory Visit | Attending: Family | Admitting: Family

## 2017-06-10 ENCOUNTER — Encounter: Payer: Self-pay | Admitting: Family

## 2017-06-10 DIAGNOSIS — M85852 Other specified disorders of bone density and structure, left thigh: Secondary | ICD-10-CM | POA: Insufficient documentation

## 2017-06-10 DIAGNOSIS — Z13820 Encounter for screening for osteoporosis: Secondary | ICD-10-CM | POA: Diagnosis present

## 2017-06-10 DIAGNOSIS — E348 Other specified endocrine disorders: Secondary | ICD-10-CM

## 2017-06-10 DIAGNOSIS — Z1231 Encounter for screening mammogram for malignant neoplasm of breast: Secondary | ICD-10-CM | POA: Diagnosis present

## 2017-06-10 DIAGNOSIS — M8580 Other specified disorders of bone density and structure, unspecified site: Secondary | ICD-10-CM

## 2017-06-10 DIAGNOSIS — Z0000 Encounter for general adult medical examination without abnormal findings: Secondary | ICD-10-CM

## 2017-06-14 NOTE — Telephone Encounter (Signed)
Could you please contact patient and see if he can get in touch with her.  She has not responded to the letter that was mailed to her.

## 2017-06-17 NOTE — Telephone Encounter (Signed)
Notified pt's son and he voices understanding. Lab appt scheduled for 06/19/17 at 1:30pm and future order has been entered.

## 2017-06-17 NOTE — Telephone Encounter (Signed)
Son's contact number is no longer in service. Called cell # listed for pt and reached a recording about a locksmith.  Left message at home number to have pt return my call.

## 2017-06-19 ENCOUNTER — Other Ambulatory Visit (INDEPENDENT_AMBULATORY_CARE_PROVIDER_SITE_OTHER): Payer: Managed Care, Other (non HMO)

## 2017-06-19 DIAGNOSIS — E875 Hyperkalemia: Secondary | ICD-10-CM

## 2017-06-19 LAB — BASIC METABOLIC PANEL
BUN: 17 mg/dL (ref 6–23)
CALCIUM: 10.4 mg/dL (ref 8.4–10.5)
CO2: 32 mEq/L (ref 19–32)
CREATININE: 0.81 mg/dL (ref 0.40–1.20)
Chloride: 103 mEq/L (ref 96–112)
GFR: 78.58 mL/min (ref >60.00)
Glucose, Bld: 95 mg/dL (ref 70–99)
Potassium: 5.4 mEq/L — ABNORMAL HIGH (ref 3.5–5.1)
Sodium: 141 mEq/L (ref 135–145)

## 2017-06-20 ENCOUNTER — Other Ambulatory Visit: Payer: Self-pay | Admitting: Family

## 2017-06-20 NOTE — Telephone Encounter (Signed)
Please contact patient and let her know that her potassium remains elevated.  She is taking any dietary supplements?    I would like her to add hctz once daily.  This will help to lower her potassium. Follow up with me in 2 weeks, with interpreter please.

## 2017-06-21 NOTE — Telephone Encounter (Signed)
Left message for pt to return my call.

## 2017-06-24 ENCOUNTER — Telehealth: Payer: Self-pay | Admitting: Family

## 2017-06-24 NOTE — Telephone Encounter (Signed)
Copied from [place] [id number]. Topic: Quick Communication - Lab Results >> Jun 24, 2017 11:21 AM Darl Householder, RMA wrote: Pt is requesting a call back concerning lab results

## 2017-06-24 NOTE — Telephone Encounter (Signed)
See 06/20/17 Refill note.

## 2017-06-24 NOTE — Telephone Encounter (Signed)
Left message for pt to return my call.    06/24/17 11:22 AM  Note    Copied from [place] #[id number]. Topic: Quick Communication - Lab Results >> Jun 24, 2017 11:21 AM Darl Householder, RMA wrote: Pt is requesting a call back concerning lab results

## 2017-06-25 ENCOUNTER — Other Ambulatory Visit: Payer: Self-pay

## 2017-06-25 DIAGNOSIS — E875 Hyperkalemia: Secondary | ICD-10-CM

## 2017-06-25 MED ORDER — HYDROCHLOROTHIAZIDE 25 MG PO TABS: 25.0000 mg | ORAL_TABLET | Freq: Every day | ORAL | 3 refills | Status: DC

## 2017-06-25 NOTE — Telephone Encounter (Signed)
Pt's son Nicole Schneider called to get pt's labs results, contacted Nicole Schneider at [facility], results given by Nicole Schneider, per Dr Nicole Schneider, "please contact patient and let her know that her potassium remains elevated.  She   Is taking any dietary supplements? Her son says that he is not sure; also "I would like her to add hctz once a day.  This will help lower her potassium. Follow up with me in 2 weeks with interpreter please"; Min states that he will have to call back to make appointment because he does not have his mother's schedule; also he says that he will pick up her medications from [place].

## 2017-06-25 NOTE — Telephone Encounter (Signed)
12.5mg  please

## 2017-06-25 NOTE — Telephone Encounter (Signed)
Nicole Schneider-- Rx was sent earlier today for HCTZ 25mg  but I see an Rx pended in this note for 12.5mg . Which dose should pt start? I cancelled the 25mg  Rx that was previously sent.  Please advise?

## 2017-06-26 MED ORDER — HYDROCHLOROTHIAZIDE 12.5 MG PO CAPS: 12.5000 mg | ORAL_CAPSULE | Freq: Every day | ORAL | 2 refills | Status: DC

## 2017-06-26 NOTE — Telephone Encounter (Signed)
rx sent for 12.5.

## 2017-07-08 ENCOUNTER — Encounter: Payer: Self-pay | Admitting: Family

## 2017-07-08 ENCOUNTER — Ambulatory Visit: Payer: Managed Care, Other (non HMO) | Admitting: Family

## 2017-07-08 VITALS — BP 119/66 | HR 70 | Temp 98.6°F | Resp 16 | Ht 60.0 in | Wt 104.4 lb

## 2017-07-08 DIAGNOSIS — E875 Hyperkalemia: Secondary | ICD-10-CM

## 2017-07-08 LAB — BASIC METABOLIC PANEL
BUN: 19 mg/dL (ref 6–23)
CO2: 36 mEq/L — ABNORMAL HIGH (ref 19–32)
CREATININE: 0.9 mg/dL (ref 0.40–1.20)
Calcium: 10.3 mg/dL (ref 8.4–10.5)
Chloride: 97 mEq/L (ref 96–112)
GFR: 69.57 mL/min (ref >60.00)
Glucose, Bld: 104 mg/dL — ABNORMAL HIGH (ref 70–99)
POTASSIUM: 3.7 meq/L (ref 3.5–5.1)
Sodium: 140 mEq/L (ref 135–145)

## 2017-07-08 MED ORDER — TORSEMIDE 5 MG PO TABS: 5.0000 mg | ORAL_TABLET | Freq: Every day | ORAL | 3 refills | Status: DC

## 2017-07-08 NOTE — Progress Notes (Signed)
Subjective:    Patient ID: Nicole Schneider, female    DOB: [birth year], 52 y.o.   MRN: [id number]  HPI  Patient is a 52 year old female who presents today for follow-up.  Recently we have had issues with her having hyperkalemia.  Last potassium was 5.4.  We added low-dose hydrochlorothiazide 12.5 mg once daily.  The patient feels that the hydrochlorothiazide is depressing her appetite. Reports that she has had diminished appetite x 2 weeks.   Wt Readings from Last 3 Encounters:  07/08/17 104 lb 6.4 oz (47.4 kg)  06/05/17 107 lb (48.5 kg)  08/15/16 101 lb 12.8 oz (46.2 kg)   A burmese video interpreter was used at today's visit.     Review of Systems    see HPI  Past Medical History:  Diagnosis Date  . Allergy   . Iron deficiency anemia   . Osteopenia   . Seasonal allergies      Social History   Socioeconomic History  . Marital status: Married    Spouse name: Not on file  . Number of children: Not on file  . Years of education: Not on file  . Highest education level: Not on file  Social Needs  . Financial resource strain: Not on file  . Food insecurity - worry: Not on file  . Food insecurity - inability: Not on file  . Transportation needs - medical: Not on file  . Transportation needs - non-medical: Not on file  Occupational History  . Not on file  Tobacco Use  . Smoking status: Never Smoker  . Smokeless tobacco: Never Used  Substance and Sexual Activity  . Alcohol use: No    Alcohol/week: 0.0 oz  . Drug use: No  . Sexual activity: Not on file  Other Topics Concern  . Not on file  Social History Narrative   Works in [place]   Married   2 sons.   Grew up in [place].        Past Surgical History:  Procedure Laterality Date  . miscarriage      Family History  Problem Relation Age of Onset  . Colon cancer Neg Hx     No Known Allergies  Current Outpatient Medications on File Prior to Visit  Medication Sig Dispense Refill  . ferrous sulfate 325 (65  FE) MG tablet Take 325 mg by mouth 2 (two) times daily.      . hydrochlorothiazide (MICROZIDE) 12.5 MG capsule Take 1 capsule (12.5 mg total) by mouth daily. 30 capsule 2  . Multiple Vitamins-Minerals (ONE DAILY MULTIVITAMIN WOMEN PO) Take 1 tablet by mouth daily.    . Omega-3 Fatty Acids (FISH OIL) 1000 MG CPDR Take 1 capsule by mouth daily.       No current facility-administered medications on file prior to visit.     BP 119/66 (BP Location: Right Arm, Cuff Size: Normal)   Pulse 70   Temp 98.6 F (37 C) (Oral)   Resp 16   Ht 5' (1.524 m)   Wt 104 lb 6.4 oz (47.4 kg)   SpO2 100%   BMI 20.39 kg/m    Objective:   Physical Exam  Constitutional: She is oriented to person, place, and time. She appears well-developed and well-nourished.  HENT:  Head: Normocephalic and atraumatic.  Neck: No thyromegaly present.  Cardiovascular: Normal rate, regular rhythm and normal heart sounds.  No murmur heard. Pulmonary/Chest: Effort normal and breath sounds normal. No respiratory distress. She has no wheezes.  Musculoskeletal: She  exhibits no edema.  Lymphadenopathy:    She has no cervical adenopathy.  Neurological: She is alert and oriented to person, place, and time.  Psychiatric: She has a normal mood and affect. Her behavior is normal. Judgment and thought content normal.          Assessment & Plan:  Hyperkalemia-  Repeat bmet. D/c hctz due to poor appetite, change to demadex 5mg  once daily. List given of high potassium foods that she should limit intake of. Follow up in 2 weeks.

## 2017-07-08 NOTE — Patient Instructions (Addendum)
Stop hctz, start demadex.   Vitamin D should be 3000units once daily.  Complete lab work prior to leaving. Try to limit foods high in potassium.   High potassium content foods  Highest content (>25 meq/100 g) High content (>6.2 meq/100 g)   Vegetables   Spinach   Tomatoes   Broccoli   Winter squash   Beets   Carrots   Cauliflower   Potatoes   Fruits   Bananas  Dried figs Cantaloupe  Molasses Kiwis  Seaweed Oranges  Very high content (>12.5 meq/100 g) Mangos  Dried fruits (dates, prunes) Meats  Nuts Ground beef  Avocados Steak  Bran cereals Pork  Wheat germ Veal  Lima beans Arline Asp

## 2017-07-09 ENCOUNTER — Telehealth: Payer: Self-pay | Admitting: Family

## 2017-07-09 NOTE — Telephone Encounter (Signed)
Called  Patients     Son  In  Regards  To  The  Demadex   5 mg   Tabs .  [facility] [place] [facility]   and  [address]   Only has  The  10  Mg strength   They have  The  5 mg  Demadex   At [place] main  [place]   Near  The  [address]  and   [address] [address] .  Spoke  To the  Pharmacist   And  The    RX  Will be  Transferred    [facility] the  [facility]   AT  [address] .Pts  Son  [place]  Aware  And  He  Spoke  To the  Pharmacist  As  Well. The RX  Will be  Ready  Tonight

## 2017-07-09 NOTE — Telephone Encounter (Signed)
Copied from [place]. Topic: Quick Communication - See Telephone Encounter >> Jul 09, 2017 11:23 AM Neva Seat wrote: The latest Rx of 5 mg [facility] - only [facility] 10 mg. They are telling her the 10 mg can be cut in half. Pt's son is checking for mother if this will be ok. Please call son.  [facility] [phone] - [place], [place] - [address] AT [place] [address] [place] [address] Phone: [phone] Fax: [phone]

## 2017-08-14 ENCOUNTER — Ambulatory Visit: Payer: Managed Care, Other (non HMO) | Admitting: Family

## 2017-08-14 ENCOUNTER — Encounter: Payer: Self-pay | Admitting: Family

## 2017-08-14 VITALS — BP 117/64 | HR 74 | Temp 98.6°F | Resp 16 | Ht 60.0 in | Wt 105.0 lb

## 2017-08-14 DIAGNOSIS — E875 Hyperkalemia: Secondary | ICD-10-CM | POA: Diagnosis not present

## 2017-08-14 LAB — BASIC METABOLIC PANEL
BUN: 10 mg/dL (ref 6–23)
CALCIUM: 10.1 mg/dL (ref 8.4–10.5)
CO2: 31 meq/L (ref 19–32)
Chloride: 105 mEq/L (ref 96–112)
Creatinine, Ser: 0.9 mg/dL (ref 0.40–1.20)
GFR: 69.54 mL/min (ref >60.00)
GLUCOSE: 96 mg/dL (ref 70–99)
Potassium: 5.3 mEq/L — ABNORMAL HIGH (ref 3.5–5.1)
SODIUM: 144 meq/L (ref 135–145)

## 2017-08-14 MED ORDER — TORSEMIDE 5 MG PO TABS: 5.0000 mg | ORAL_TABLET | Freq: Every day | ORAL | 3 refills | Status: DC

## 2017-08-14 MED FILL — TORSEMIDE 10 MG TABS: 10 | 30 days supply | Qty: 15 | Fill #0

## 2017-08-14 NOTE — Patient Instructions (Signed)
Start demadex- let me know if you are unable to get it. Complete lab work prior to leaving.

## 2017-08-14 NOTE — Progress Notes (Signed)
Subjective:    Patient ID: Nicole Schneider, female    DOB: Oct 11, 1965, 52 y.o.   MRN: [id number]  HPI  Patient is a 52 yr old female who presents today for follow up. A video Burmese interpreter was used today.   Last visit she felt that hctz was decreasing her appetite. We stopped hctz and started demadex. She reports that the pharmacy was unable to get demadex so she never started.  She reports improvement in her appetite. Denies palpitations.   Wt Readings from Last 3 Encounters:  08/14/17 105 lb (47.6 kg)  07/08/17 104 lb 6.4 oz (47.4 kg)  06/05/17 107 lb (48.5 kg)    Review of Systems    see HPI  Past Medical History:  Diagnosis Date  . Allergy   . Iron deficiency anemia   . Osteopenia   . Seasonal allergies      Social History   Socioeconomic History  . Marital status: Married    Spouse name: Not on file  . Number of children: Not on file  . Years of education: Not on file  . Highest education level: Not on file  Occupational History  . Not on file  Social Needs  . Financial resource strain: Not on file  . Food insecurity:    Worry: Not on file    Inability: Not on file  . Transportation needs:    Medical: Not on file    Non-medical: Not on file  Tobacco Use  . Smoking status: Never Smoker  . Smokeless tobacco: Never Used  Substance and Sexual Activity  . Alcohol use: No    Alcohol/week: 0.0 oz  . Drug use: No  . Sexual activity: Not on file  Lifestyle  . Physical activity:    Days per week: Not on file    Minutes per session: Not on file  . Stress: Not on file  Relationships  . Social connections:    Talks on phone: Not on file    Gets together: Not on file    Attends religious service: Not on file    Active member of club or organization: Not on file    Attends meetings of clubs or organizations: Not on file    Relationship status: Not on file  . Intimate partner violence:    Fear of current or ex partner: Not on file    Emotionally abused:  Not on file    Physically abused: Not on file    Forced sexual activity: Not on file  Other Topics Concern  . Not on file  Social History Narrative   Works in [place]   Married   2 sons.   Grew up in [place].        Past Surgical History:  Procedure Laterality Date  . miscarriage      Family History  Problem Relation Age of Onset  . Colon cancer Neg Hx     No Known Allergies  Current Outpatient Medications on File Prior to Visit  Medication Sig Dispense Refill  . ferrous sulfate 325 (65 FE) MG tablet Take 325 mg by mouth 2 (two) times daily.      . Multiple Vitamins-Minerals (ONE DAILY MULTIVITAMIN WOMEN PO) Take 1 tablet by mouth daily.    . Omega-3 Fatty Acids (FISH OIL) 1000 MG CPDR Take 1 capsule by mouth daily.      Marland Kitchen torsemide (DEMADEX) 5 MG tablet Take 1 tablet (5 mg total) by mouth daily. 30 tablet 3  No current facility-administered medications on file prior to visit.     BP 117/64 (BP Location: Left Arm, Patient Position: Sitting, Cuff Size: Small)   Pulse 74   Temp 98.6 F (37 C) (Oral)   Resp 16   Ht 5' (1.524 m)   Wt 105 lb (47.6 kg)   SpO2 100%   BMI 20.51 kg/m    Objective:   Physical Exam  Constitutional: She is oriented to person, place, and time. She appears well-developed and well-nourished.  HENT:  Head: Normocephalic and atraumatic.  Cardiovascular: Normal rate, regular rhythm and normal heart sounds.  No murmur heard. Pulmonary/Chest: Effort normal and breath sounds normal. No respiratory distress. She has no wheezes.  Lymphadenopathy:    She has no cervical adenopathy.  Neurological: She is alert and oriented to person, place, and time.  Skin: Skin is warm and dry.  Psychiatric: She has a normal mood and affect. Her behavior is normal. Judgment and thought content normal.          Assessment & Plan:  Hyperkalemia- rx sent to a different pharmacy for demadex. Check follow up bmet today. Follow up in 1 month.

## 2017-08-22 ENCOUNTER — Telehealth: Payer: Self-pay | Admitting: Family

## 2017-08-22 DIAGNOSIS — E875 Hyperkalemia: Secondary | ICD-10-CM

## 2017-08-22 NOTE — Telephone Encounter (Signed)
Son calling back for results, No DPR on file Copied from [place] [id number]. Topic: Quick Communication - Lab Results >> Aug 21, 2017  9:28 AM Ronny Flurry, CMA wrote: See 08/14/17 result note. [place] for [facility] / triage to discuss with pt.

## 2017-08-22 NOTE — Telephone Encounter (Signed)
Notified pt's son of 08/14/17 result note. He will have to call us back tomorrow with patient's schedule. Future lab order entered.   There is a Games developer note from 2011 allowing son access to [facility]. Pt does not speak / comprehend english well and we discuss results etc... With her son, Min.

## 2017-09-11 ENCOUNTER — Encounter: Payer: Self-pay | Admitting: Family

## 2017-09-11 ENCOUNTER — Ambulatory Visit: Payer: Managed Care, Other (non HMO) | Admitting: Family

## 2017-09-11 VITALS — BP 117/62 | HR 65 | Temp 98.7°F | Resp 16 | Ht 60.0 in | Wt 104.8 lb

## 2017-09-11 DIAGNOSIS — M8580 Other specified disorders of bone density and structure, unspecified site: Secondary | ICD-10-CM

## 2017-09-11 DIAGNOSIS — E875 Hyperkalemia: Secondary | ICD-10-CM | POA: Diagnosis not present

## 2017-09-11 LAB — BASIC METABOLIC PANEL
BUN: 15 mg/dL (ref 6–23)
CALCIUM: 9.9 mg/dL (ref 8.4–10.5)
CO2: 31 mEq/L (ref 19–32)
Chloride: 105 mEq/L (ref 96–112)
Creatinine, Ser: 0.76 mg/dL (ref 0.40–1.20)
GFR: 84.5 mL/min (ref >60.00)
GLUCOSE: 90 mg/dL (ref 70–99)
POTASSIUM: 4.1 meq/L (ref 3.5–5.1)
SODIUM: 144 meq/L (ref 135–145)

## 2017-09-11 NOTE — Patient Instructions (Signed)
Please complete lab work prior to leaving.   

## 2017-09-11 NOTE — Progress Notes (Signed)
Subjective:    Patient ID: Nicole Schneider, female    DOB: [birth year], 52 y.o.   MRN: [id number]  HPI   Nicole Schneider is a 52 yr old female who presents today for follow up of her hyperkalemia. We recently placed her on demadex. She reports tolerating without difficulty.  Osteopenia- she is asking what dose of vitamin D she should be on.    Review of Systems See HPI  Past Medical History:  Diagnosis Date  . Allergy   . Iron deficiency anemia   . Osteopenia   . Seasonal allergies      Social History   Socioeconomic History  . Marital status: Married    Spouse name: Not on file  . Number of children: Not on file  . Years of education: Not on file  . Highest education level: Not on file  Occupational History  . Not on file  Social Needs  . Financial resource strain: Not on file  . Food insecurity:    Worry: Not on file    Inability: Not on file  . Transportation needs:    Medical: Not on file    Non-medical: Not on file  Tobacco Use  . Smoking status: Never Smoker  . Smokeless tobacco: Never Used  Substance and Sexual Activity  . Alcohol use: No    Alcohol/week: 0.0 oz  . Drug use: No  . Sexual activity: Not on file  Lifestyle  . Physical activity:    Days per week: Not on file    Minutes per session: Not on file  . Stress: Not on file  Relationships  . Social connections:    Talks on phone: Not on file    Gets together: Not on file    Attends religious service: Not on file    Active member of club or organization: Not on file    Attends meetings of clubs or organizations: Not on file    Relationship status: Not on file  . Intimate partner violence:    Fear of current or ex partner: Not on file    Emotionally abused: Not on file    Physically abused: Not on file    Forced sexual activity: Not on file  Other Topics Concern  . Not on file  Social History Narrative   Works in [employer]   Married   2 sons.   Grew up in [place].        Past Surgical History:    Procedure Laterality Date  . miscarriage      Family History  Problem Relation Age of Onset  . Colon cancer Neg Hx     No Known Allergies  Current Outpatient Medications on File Prior to Visit  Medication Sig Dispense Refill  . ferrous sulfate 325 (65 FE) MG tablet Take 325 mg by mouth 2 (two) times daily.      . Multiple Vitamins-Minerals (ONE DAILY MULTIVITAMIN WOMEN PO) Take 1 tablet by mouth daily.    . Omega-3 Fatty Acids (FISH OIL) 1000 MG CPDR Take 1 capsule by mouth daily.      Marland Kitchen torsemide (DEMADEX) 5 MG tablet Take 1 tablet (5 mg total) by mouth daily. 30 tablet 3   No current facility-administered medications on file prior to visit.     BP 117/62 (BP Location: Left Arm, Patient Position: Sitting, Cuff Size: Small)   Pulse 65   Temp 98.7 F (37.1 C) (Oral)   Resp 16   Ht 5' (1.524 m)  Wt 104 lb 12.8 oz (47.5 kg)   SpO2 100%   BMI 20.47 kg/m       Objective:   Physical Exam  Constitutional: She is oriented to person, place, and time. She appears well-developed and well-nourished.  Cardiovascular: Normal rate, regular rhythm and normal heart sounds.  No murmur heard. Pulmonary/Chest: Effort normal and breath sounds normal. No respiratory distress. She has no wheezes.  Neurological: She is alert and oriented to person, place, and time.  Skin: Skin is warm and dry.  Psychiatric: She has a normal mood and affect. Her behavior is normal. Judgment and thought content normal.         Assessment & Plan:  Hyperkalemia- now on demadex.  Tolerating without difficulty.  Obtain follow up bmet.   Osteopenia- check vit D level.

## 2017-09-14 LAB — VITAMIN D 1,25 DIHYDROXY
VITAMIN D 1, 25 (OH) TOTAL: 55 pg/mL (ref 18–72)
VITAMIN D3 1, 25 (OH): 55 pg/mL

## 2017-09-16 ENCOUNTER — Other Ambulatory Visit: Payer: Self-pay | Admitting: Family

## 2017-09-16 MED ORDER — VITAMIN D3 75 MCG (3000 UT) PO TABS: 1.0000 | ORAL_TABLET | Freq: Every day | ORAL | Status: AC

## 2017-09-23 ENCOUNTER — Telehealth: Payer: Self-pay | Admitting: *Deleted

## 2017-09-23 NOTE — Telephone Encounter (Signed)
Mailed results to pt.

## 2017-09-23 NOTE — Telephone Encounter (Signed)
-----   Message from [name], NP sent at 09/16/2017  7:17 AM EDT ----- Potassium looks good, please continue demadex. She asked me if OK to continue vit D.  Vit D level looks good. If she is taking vit D she should continue 3000iu once daily please.

## 2018-03-12 ENCOUNTER — Other Ambulatory Visit: Payer: Self-pay

## 2018-03-12 ENCOUNTER — Emergency Department (HOSPITAL_BASED_OUTPATIENT_CLINIC_OR_DEPARTMENT_OTHER)
Admission: EM | Admit: 2018-03-12 | Discharge: 2018-03-12 | Disposition: A | Discharge status: Home / Self Care | Payer: Managed Care, Other (non HMO) | Attending: Emergency Medicine | Admitting: Emergency Medicine

## 2018-03-12 ENCOUNTER — Encounter (HOSPITAL_BASED_OUTPATIENT_CLINIC_OR_DEPARTMENT_OTHER): Payer: Self-pay | Admitting: Emergency Medicine

## 2018-03-12 DIAGNOSIS — M79644 Pain in right finger(s): Secondary | ICD-10-CM | POA: Diagnosis present

## 2018-03-12 DIAGNOSIS — M779 Enthesopathy, unspecified: Secondary | ICD-10-CM | POA: Insufficient documentation

## 2018-03-12 DIAGNOSIS — Z79899 Other long term (current) drug therapy: Secondary | ICD-10-CM | POA: Diagnosis not present

## 2018-03-12 DIAGNOSIS — M778 Other enthesopathies, not elsewhere classified: Secondary | ICD-10-CM

## 2018-03-12 NOTE — ED Provider Notes (Signed)
[facility] EMERGENCY DEPARTMENT Provider Note   CSN: [id number] Arrival date & time: 03/12/18  1153     History   Chief Complaint Chief Complaint  Patient presents with  . Hand Pain   Burmese interpreter utilized  HPI Nicole Schneider is a 52 y.o. female.  HPI Patient presents the emergency department with approximately 6 weeks of worsening pain of her right index finger along the middle and proximal phalanx on the volar aspect with some extension down into the palm itself.  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  This is been bothering her consistently for the past 6 weeks.  No fevers or chills.  No other injury or trauma.  Only pain with flexion.  She reports mild swelling of her right index finger.   Past Medical History:  Diagnosis Date  . Allergy   . Iron deficiency anemia   . Osteopenia   . Seasonal allergies     Patient Active Problem List   Diagnosis Date Noted  . Osteopenia   . Routine general medical examination at a health care facility 03/30/2013  . Iron deficiency anemia 06/13/2009  . CARPAL TUNNEL SYNDROME, BILATERAL 06/03/2009  . HEADACHE 06/03/2009    Past Surgical History:  Procedure Laterality Date  . miscarriage       OB History   None      Home Medications    Prior to Admission medications   Medication Sig Start Date End Date Taking? Authorizing Provider  Cholecalciferol (VITAMIN D3) 3000 units TABS Take 1 tablet by mouth daily. 09/16/17   [name], NP  ferrous sulfate 325 (65 FE) MG tablet Take 325 mg by mouth 2 (two) times daily.      [provider]  Multiple Vitamins-Minerals (ONE DAILY MULTIVITAMIN WOMEN PO) Take 1 tablet by mouth daily.    [provider]  Omega-3 Fatty Acids (FISH OIL) 1000 MG CPDR Take 1 capsule by mouth daily.      [provider]  torsemide (DEMADEX) 5 MG tablet Take 1 tablet (5 mg total) by mouth  daily. 08/14/17   [name], NP    Family History Family History  Problem Relation Age of Onset  . Colon cancer Neg Hx     Social History Social History   Tobacco Use  . Smoking status: Never Smoker  . Smokeless tobacco: Never Used  Substance Use Topics  . Alcohol use: No    Alcohol/week: 0.0 standard drinks  . Drug use: No     Allergies   Patient has no known allergies.   Review of Systems Review of Systems  All other systems reviewed and are negative.    Physical Exam Updated Vital Signs BP (!) 143/88 (BP Location: Right Arm)   Temp 98.2 F (36.8 C) (Oral)   Resp 14   Ht 5' (1.524 m)   Wt 49.4 kg   SpO2 100%   BMI 21.29 kg/m   Physical Exam  Constitutional: She is oriented to person, place, and time. She appears well-developed and well-nourished.  HENT:  Head: Normocephalic.  Eyes: EOM are normal.  Neck: Normal range of motion.  Pulmonary/Chest: Effort normal.  Abdominal: She exhibits no distension.  Musculoskeletal: Normal range of motion.  Full range of motion of right index finger at the DIP and PIP joint.  Normal range of motion at the MCP joint.  Mild fullness and swelling overlying the flexor tendon sheath  without overlying erythema or warmth or evidence of abscess.  Neurological: She is alert and oriented to person, place, and time.  Psychiatric: She has a normal mood and affect.  Nursing note and vitals reviewed.    ED Treatments / Results  Labs (all labs ordered are listed, but only abnormal results are displayed) Labs Reviewed - No data to display  EKG None  Radiology No results found.  Procedures .Splint Application Performed by: Jola Schmidt, MD Authorized by: Jola Schmidt, MD     SPLINT APPLICATION Authorized by: Jola Schmidt Consent: Verbal consent obtained. Risks and benefits: risks, benefits and alternatives were discussed Consent given by: patient Splint applied by: Myself  Location details: Right index  finger Splint type: Buddy tape Supplies used: Athletic tape Post-procedure: The splinted body part was neurovascularly unchanged following the procedure. Patient tolerance: Patient tolerated the procedure well with no immediate complications.     Medications Ordered in ED Medications - No data to display   Initial Impression / Assessment and Plan / ED Course  I have reviewed the triage vital signs and the nursing notes.  Pertinent labs & imaging results that were available during my care of the patient were reviewed by me and considered in my medical decision making (see chart for details).     Suspected overuse injury and associated tendinitis of the right index flexor tendon.  Sports medicine follow-up.  No signs of infection at this time.  Buddy tape for comfort.  Final Clinical Impressions(s) / ED Diagnoses   Final diagnoses:  Tendonitis of finger    ED Discharge Orders    None       Jola Schmidt, MD 03/12/18 1322

## 2018-03-12 NOTE — ED Triage Notes (Signed)
Pt states she has right hand pain near index finger joint for over one month.  No injury.  Pt states it only causes pain with movement.

## 2018-03-13 ENCOUNTER — Encounter: Payer: Self-pay | Admitting: Family Medicine

## 2018-03-13 ENCOUNTER — Ambulatory Visit (INDEPENDENT_AMBULATORY_CARE_PROVIDER_SITE_OTHER): Payer: Managed Care, Other (non HMO) | Admitting: Family Medicine

## 2018-03-13 VITALS — BP 154/83 | HR 76 | Ht 60.0 in | Wt 109.0 lb

## 2018-03-13 DIAGNOSIS — M79644 Pain in right finger(s): Secondary | ICD-10-CM | POA: Diagnosis not present

## 2018-03-13 MED ORDER — DICLOFENAC SODIUM 75 MG PO TBEC: 75.0000 mg | DELAYED_RELEASE_TABLET | Freq: Two times a day (BID) | ORAL | 1 refills | Status: DC

## 2018-03-13 NOTE — Patient Instructions (Signed)
You have a tenosynovitis of your index finger. Ice the area 15 minutes at a time 3-4 times a day. Brace this one of the three ways as often as possible to help prevent more inflammation in the area. Diclofenac twice a day with food for pain and inflammation. If not improving would consider injection into the tendon sheath. Follow up with me in 1 month to 6 weeks.

## 2018-03-14 ENCOUNTER — Encounter: Payer: Self-pay | Admitting: Family Medicine

## 2018-03-14 NOTE — Progress Notes (Signed)
PCP: [name], NP  Subjective:   HPI: Nicole Schneider is a 52 y.o. female here for right finger pain.  Interpreter line used for visit. Nicole Schneider reports about 1 1/2 months ago she started to get pain and swelling, stiffness on palmar side of right index finger. No acute injury or trauma. Pain is 2/10 and difficulty trying to grip things. She is right handed. Tried icing, epsom salt soaks without much benefit. No skin changes, numbness.  Past Medical History:  Diagnosis Date  . Allergy   . Iron deficiency anemia   . Osteopenia   . Seasonal allergies     Current Outpatient Medications on File Prior to Visit  Medication Sig Dispense Refill  . Cholecalciferol (VITAMIN D3) 3000 units TABS Take 1 tablet by mouth daily. 30 tablet   . ferrous sulfate 325 (65 FE) MG tablet Take 325 mg by mouth 2 (two) times daily.      . Multiple Vitamins-Minerals (ONE DAILY MULTIVITAMIN WOMEN PO) Take 1 tablet by mouth daily.    . Omega-3 Fatty Acids (FISH OIL) 1000 MG CPDR Take 1 capsule by mouth daily.      Marland Kitchen torsemide (DEMADEX) 5 MG tablet Take 1 tablet (5 mg total) by mouth daily. 30 tablet 3   No current facility-administered medications on file prior to visit.     Past Surgical History:  Procedure Laterality Date  . miscarriage      No Known Allergies  Social History   Socioeconomic History  . Marital status: Married    Spouse name: Not on file  . Number of children: Not on file  . Years of education: Not on file  . Highest education level: Not on file  Occupational History  . Not on file  Social Needs  . Financial resource strain: Not on file  . Food insecurity:    Worry: Not on file    Inability: Not on file  . Transportation needs:    Medical: Not on file    Non-medical: Not on file  Tobacco Use  . Smoking status: Never Smoker  . Smokeless tobacco: Never Used  Substance and Sexual Activity  . Alcohol use: No    Alcohol/week: 0.0 standard drinks  . Drug use: No  .  Sexual activity: Not on file  Lifestyle  . Physical activity:    Days per week: Not on file    Minutes per session: Not on file  . Stress: Not on file  Relationships  . Social connections:    Talks on phone: Not on file    Gets together: Not on file    Attends religious service: Not on file    Active member of club or organization: Not on file    Attends meetings of clubs or organizations: Not on file    Relationship status: Not on file  . Intimate partner violence:    Fear of current or ex partner: Not on file    Emotionally abused: Not on file    Physically abused: Not on file    Forced sexual activity: Not on file  Other Topics Concern  . Not on file  Social History Narrative   Works in [employer]   Married   2 sons.   Grew up in [place].        Family History  Problem Relation Age of Onset  . Colon cancer Neg Hx     BP (!) 154/83   Pulse 76   Ht 5' (1.524 m)   [employer]  109 lb (49.4 kg)   BMI 21.29 kg/m   Review of Systems: See HPI above.     Objective:  Physical Exam:  Gen: NAD, comfortable in exam room  Right hand/2nd digit: No deformity, malrotation, angulation. Unable to fully flex at PIP of 2nd digit.  FROM other joints, digits.  5/5 strength flexion and extension at PIP, DIP, MCP joints 2nd digit. TTP over A1 pulley 2nd digit and into flexor tendon area of proximal phalanx. NVI distally. Negative tinels, dequervains.  Left hand: No deformity. FROM with 5/5 strength. No tenderness to palpation. NVI distally.   MSK u/s 2nd digit:  Focal tenosynovitis proximal portion 2nd digit.  No tendon tear, other abnormalities.  Assessment & Plan:  1. Right 2nd digit flexor tenosynovitis.  Discussed options - she would like to try bracing PIP in extension, diclofenac with icing.  Consider injection if not improving.  F/u in 1 month to 6 weeks.

## 2018-04-10 ENCOUNTER — Ambulatory Visit (INDEPENDENT_AMBULATORY_CARE_PROVIDER_SITE_OTHER): Payer: Managed Care, Other (non HMO) | Admitting: Family Medicine

## 2018-04-10 ENCOUNTER — Encounter: Payer: Self-pay | Admitting: Family Medicine

## 2018-04-10 VITALS — BP 132/84 | HR 76 | Ht 60.0 in | Wt 110.0 lb

## 2018-04-10 DIAGNOSIS — M79644 Pain in right finger(s): Secondary | ICD-10-CM

## 2018-04-10 MED ORDER — MELOXICAM 15 MG PO TABS: 15.0000 mg | ORAL_TABLET | Freq: Every day | ORAL | 1 refills | Status: DC

## 2018-04-10 NOTE — Progress Notes (Signed)
PCP: [name], NP  Subjective:   HPI: Patient is a 52 y.o. female here for right finger pain.  11/7: Interpreter line used for visit. Patient reports about 1 1/2 months ago she started to get pain and swelling, stiffness on palmar side of right index finger. No acute injury or trauma. Pain is 2/10 and difficulty trying to grip things. She is right handed. Tried icing, epsom salt soaks without much benefit. No skin changes, numbness.  12/5: Interpreter present for visit. Patient reports mild improvement since last visit. Diclofenac was helping but irritating her stomach. Has been wearing brace on finger. Pain level 2/10 and sore, hard to grip things. No numbness.  Past Medical History:  Diagnosis Date  . Allergy   . Iron deficiency anemia   . Osteopenia   . Seasonal allergies     Current Outpatient Medications on File Prior to Visit  Medication Sig Dispense Refill  . Cholecalciferol (VITAMIN D3) 3000 units TABS Take 1 tablet by mouth daily. 30 tablet   . diclofenac (VOLTAREN) 75 MG EC tablet Take 1 tablet (75 mg total) by mouth 2 (two) times daily. 60 tablet 1  . ferrous sulfate 325 (65 FE) MG tablet Take 325 mg by mouth 2 (two) times daily.      . Multiple Vitamins-Minerals (ONE DAILY MULTIVITAMIN WOMEN PO) Take 1 tablet by mouth daily.    . Omega-3 Fatty Acids (FISH OIL) 1000 MG CPDR Take 1 capsule by mouth daily.      Marland Kitchen torsemide (DEMADEX) 5 MG tablet Take 1 tablet (5 mg total) by mouth daily. 30 tablet 3   No current facility-administered medications on file prior to visit.     Past Surgical History:  Procedure Laterality Date  . miscarriage      No Known Allergies  Social History   Socioeconomic History  . Marital status: Married    Spouse name: Not on file  . Number of children: Not on file  . Years of education: Not on file  . Highest education level: Not on file  Occupational History  . Not on file  Social Needs  . Financial resource  strain: Not on file  . Food insecurity:    Worry: Not on file    Inability: Not on file  . Transportation needs:    Medical: Not on file    Non-medical: Not on file  Tobacco Use  . Smoking status: Never Smoker  . Smokeless tobacco: Never Used  Substance and Sexual Activity  . Alcohol use: No    Alcohol/week: 0.0 standard drinks  . Drug use: No  . Sexual activity: Not on file  Lifestyle  . Physical activity:    Days per week: Not on file    Minutes per session: Not on file  . Stress: Not on file  Relationships  . Social connections:    Talks on phone: Not on file    Gets together: Not on file    Attends religious service: Not on file    Active member of club or organization: Not on file    Attends meetings of clubs or organizations: Not on file    Relationship status: Not on file  . Intimate partner violence:    Fear of current or ex partner: Not on file    Emotionally abused: Not on file    Physically abused: Not on file    Forced sexual activity: Not on file  Other Topics Concern  . Not on file  Social  History Narrative   Works in [employer]   Married   2 sons.   Grew up in [place].        Family History  Problem Relation Age of Onset  . Colon cancer Neg Hx     BP 132/84   Pulse 76   Ht 5' (1.524 m)   Wt 110 lb (49.9 kg)   BMI 21.48 kg/m   Review of Systems: See HPI above.     Objective:  Physical Exam:  Gen: NAD, comfortable in exam room  Right 2nd digit: No deformity, malrotation, angulation. Mild limitation flexion at PIP.  From other joints and digits with 5/5 strength all joints. Collateral ligaments intact. Mild tenderness to palpation proximal flexor tendon. NVI distally.  Assessment & Plan:  1. Right 2nd digit flexor tenosynovitis.  Mild improvement since last visit - will switch to meloxicam instead of the diclofenac to see if this is tolerable for her.  Continue splinting.  Consider injection, OT if not improving - declined these at this  time.  F/u in 6 weeks.

## 2018-04-10 NOTE — Patient Instructions (Signed)
Continue with the splinting. Take meloxicam daily with food. Follow up with me in 6 weeks but you can call me sooner if you want to try therapy and/or an injection.

## 2018-05-23 ENCOUNTER — Encounter: Payer: Self-pay | Admitting: Family Medicine

## 2018-05-23 ENCOUNTER — Ambulatory Visit (INDEPENDENT_AMBULATORY_CARE_PROVIDER_SITE_OTHER): Payer: Managed Care, Other (non HMO) | Admitting: Family Medicine

## 2018-05-23 VITALS — BP 127/78 | HR 78 | Ht 60.0 in | Wt 110.0 lb

## 2018-05-23 DIAGNOSIS — M79644 Pain in right finger(s): Secondary | ICD-10-CM | POA: Diagnosis not present

## 2018-05-23 MED ORDER — METHYLPREDNISOLONE ACETATE 40 MG/ML IJ SUSP
20.0000 mg | Freq: Once | INTRAMUSCULAR | Status: AC
  Administered 2018-05-23: 20 mg via INTRA_ARTICULAR

## 2018-05-23 NOTE — Patient Instructions (Addendum)
You were given an injection into the flexor tendon sheath of your finger today. This can take a few days to kick in. Aleve 1-2 tabs twice a day as needed. Icing 15 minutes at a time 3-4 times a day as needed. Follow up with me in 1 month for reevaluation.

## 2018-05-24 ENCOUNTER — Encounter: Payer: Self-pay | Admitting: Family Medicine

## 2018-05-24 NOTE — Progress Notes (Signed)
PCP: [name], NP  Subjective:   HPI: Patient is a 53 y.o. female here for right finger pain.  11/7: Interpreter line used for visit. Patient reports about 1 1/2 months ago she started to get pain and swelling, stiffness on palmar side of right index finger. No acute injury or trauma. Pain is 2/10 and difficulty trying to grip things. She is right handed. Tried icing, epsom salt soaks without much benefit. No skin changes, numbness.  12/5: Interpreter present for visit. Patient reports mild improvement since last visit. Diclofenac was helping but irritating her stomach. Has been wearing brace on finger. Pain level 2/10 and sore, hard to grip things. No numbness.  05/23/18: Interpreter present for visit. She reports pain is about the same as last visit. Took meloxicam - tolerated some better but didn't seem to help much. Has been splinting. Difficulty flexing her right index finger. No numbness.  Past Medical History:  Diagnosis Date  . Allergy   . Iron deficiency anemia   . Osteopenia   . Seasonal allergies     Current Outpatient Medications on File Prior to Visit  Medication Sig Dispense Refill  . Cholecalciferol (VITAMIN D3) 3000 units TABS Take 1 tablet by mouth daily. 30 tablet   . diclofenac (VOLTAREN) 75 MG EC tablet Take 1 tablet (75 mg total) by mouth 2 (two) times daily. 60 tablet 1  . ferrous sulfate 325 (65 FE) MG tablet Take 325 mg by mouth 2 (two) times daily.      . meloxicam (MOBIC) 15 MG tablet Take 1 tablet (15 mg total) by mouth daily. 30 tablet 1  . Multiple Vitamins-Minerals (ONE DAILY MULTIVITAMIN WOMEN PO) Take 1 tablet by mouth daily.    . Omega-3 Fatty Acids (FISH OIL) 1000 MG CPDR Take 1 capsule by mouth daily.      Marland Kitchen torsemide (DEMADEX) 5 MG tablet Take 1 tablet (5 mg total) by mouth daily. 30 tablet 3   No current facility-administered medications on file prior to visit.     Past Surgical History:  Procedure Laterality Date   . miscarriage      No Known Allergies  Social History   Socioeconomic History  . Marital status: Married    Spouse name: Not on file  . Number of children: Not on file  . Years of education: Not on file  . Highest education level: Not on file  Occupational History  . Not on file  Social Needs  . Financial resource strain: Not on file  . Food insecurity:    Worry: Not on file    Inability: Not on file  . Transportation needs:    Medical: Not on file    Non-medical: Not on file  Tobacco Use  . Smoking status: Never Smoker  . Smokeless tobacco: Never Used  Substance and Sexual Activity  . Alcohol use: No    Alcohol/week: 0.0 standard drinks  . Drug use: No  . Sexual activity: Not on file  Lifestyle  . Physical activity:    Days per week: Not on file    Minutes per session: Not on file  . Stress: Not on file  Relationships  . Social connections:    Talks on phone: Not on file    Gets together: Not on file    Attends religious service: Not on file    Active member of club or organization: Not on file    Attends meetings of clubs or organizations: Not on file  Relationship status: Not on file  . Intimate partner violence:    Fear of current or ex partner: Not on file    Emotionally abused: Not on file    Physically abused: Not on file    Forced sexual activity: Not on file  Other Topics Concern  . Not on file  Social History Narrative   Works in [employer]   Married   2 sons.   Grew up in [place].        Family History  Problem Relation Age of Onset  . Colon cancer Neg Hx     BP 127/78   Pulse 78   Ht 5' (1.524 m)   Wt 110 lb (49.9 kg)   BMI 21.48 kg/m   Review of Systems: See HPI above.     Objective:  Physical Exam:  Gen: NAD, comfortable in exam room  Right 2nd digit: No deformity, malrotation, angulation. Mild flexion limitation at PIP.  FROM other joints and digits with 5/5 strength all joints. Collateral ligaments intact PIP and DIP  joints. Tenderness mildly to palpation proximal flexor tendon. NVI distally.  Assessment & Plan:  1. Right 2nd digit flexor tenosynovitis - no improvement since last visit.  Discussed OT vs injection - went ahead with injection today.  Icing, aleve if needed.  F/u in 1 month.  After informed written consent timeout was performed.  Patient was seated in chair in exam room.  Area overlying right 2nd digit flexor tendon prepped with alcohol swab then tendon sheath injected with 0.5:0.7mL bupivicaine:depomedrol.  Patient tolerated procedure well without immediate complications.

## 2018-06-10 ENCOUNTER — Encounter: Payer: Self-pay | Admitting: Family

## 2018-06-10 ENCOUNTER — Ambulatory Visit (INDEPENDENT_AMBULATORY_CARE_PROVIDER_SITE_OTHER): Payer: Managed Care, Other (non HMO) | Admitting: Family

## 2018-06-10 VITALS — BP 117/68 | HR 64 | Temp 98.6°F | Resp 16 | Ht 60.0 in | Wt 108.0 lb

## 2018-06-10 DIAGNOSIS — D509 Iron deficiency anemia, unspecified: Secondary | ICD-10-CM

## 2018-06-10 DIAGNOSIS — Z23 Encounter for immunization: Secondary | ICD-10-CM | POA: Diagnosis not present

## 2018-06-10 DIAGNOSIS — Z0000 Encounter for general adult medical examination without abnormal findings: Secondary | ICD-10-CM

## 2018-06-10 LAB — URINALYSIS, ROUTINE W REFLEX MICROSCOPIC
Bilirubin Urine: NEGATIVE
Hgb urine dipstick: NEGATIVE
Ketones, ur: NEGATIVE
Nitrite: NEGATIVE
RBC / HPF: NONE SEEN (ref 0–?)
Specific Gravity, Urine: 1.02 (ref 1.000–1.030)
Total Protein, Urine: NEGATIVE
Urine Glucose: NEGATIVE
Urobilinogen, UA: 0.2 (ref 0.0–1.0)
pH: 6 (ref 5.0–8.0)

## 2018-06-10 LAB — BASIC METABOLIC PANEL
BUN: 15 mg/dL (ref 6–23)
CO2: 30 mEq/L (ref 19–32)
Calcium: 9.8 mg/dL (ref 8.4–10.5)
Chloride: 105 mEq/L (ref 96–112)
Creatinine, Ser: 0.72 mg/dL (ref 0.40–1.20)
GFR: 84.38 mL/min (ref >60.00)
GLUCOSE: 85 mg/dL (ref 70–99)
POTASSIUM: 4.3 meq/L (ref 3.5–5.1)
SODIUM: 142 meq/L (ref 135–145)

## 2018-06-10 LAB — FERRITIN: Ferritin: 72.4 ng/mL (ref 10.0–291.0)

## 2018-06-10 LAB — CBC WITH DIFFERENTIAL/PLATELET
BASOS PCT: 0.7 % (ref 0.0–3.0)
Basophils Absolute: 0 10*3/uL (ref 0.0–0.1)
EOS ABS: 0.6 10*3/uL (ref 0.0–0.7)
Eosinophils Relative: 8.1 % — ABNORMAL HIGH (ref 0.0–5.0)
HCT: 36 % (ref 36.0–46.0)
HEMOGLOBIN: 11.7 g/dL — AB (ref 12.0–15.0)
Lymphocytes Relative: 33.8 % (ref 12.0–46.0)
Lymphs Abs: 2.5 10*3/uL (ref 0.7–4.0)
MCHC: 32.4 g/dL (ref 30.0–36.0)
MCV: 79 fl (ref 78.0–100.0)
MONO ABS: 0.4 10*3/uL (ref 0.1–1.0)
Monocytes Relative: 5.9 % (ref 3.0–12.0)
NEUTROS PCT: 51.5 % (ref 43.0–77.0)
Neutro Abs: 3.8 10*3/uL (ref 1.4–7.7)
PLATELETS: 226 10*3/uL (ref 150.0–400.0)
RBC: 4.57 Mil/uL (ref 3.87–5.11)
RDW: 13.5 % (ref 11.5–15.5)
WBC: 7.3 10*3/uL (ref 4.0–10.5)

## 2018-06-10 LAB — LIPID PANEL
Cholesterol: 217 mg/dL — ABNORMAL HIGH (ref 0–200)
HDL: 59.1 mg/dL (ref >39.00)
LDL Cholesterol: 138 mg/dL — ABNORMAL HIGH (ref 0–99)
NONHDL: 158
Total CHOL/HDL Ratio: 4
Triglycerides: 101 mg/dL (ref 0.0–149.0)
VLDL: 20.2 mg/dL (ref 0.0–40.0)

## 2018-06-10 LAB — HEPATIC FUNCTION PANEL
ALBUMIN: 4.6 g/dL (ref 3.5–5.2)
ALK PHOS: 58 U/L (ref 39–117)
ALT: 14 U/L (ref 0–35)
AST: 15 U/L (ref 0–37)
Bilirubin, Direct: 0.1 mg/dL (ref 0.0–0.3)
Total Bilirubin: 0.8 mg/dL (ref 0.2–1.2)
Total Protein: 7.1 g/dL (ref 6.0–8.3)

## 2018-06-10 LAB — IRON: Iron: 134 ug/dL (ref 42–145)

## 2018-06-10 LAB — TSH: TSH: 1.04 u[IU]/mL (ref 0.35–4.50)

## 2018-06-10 NOTE — Progress Notes (Signed)
Subjective:    Patient ID: Lucia Estelle, female    DOB: 04/20/1966, 53 y.o.   MRN: [id number]  HPI   Patient presents today for complete physical.  Immunizations: tetanus and flu up to date. Due for shingrix.  Diet:  healthy Exercise: walks 2-3 days a week Colonoscopy: 2017 Dexa: 2/19 Pap Smear: 05/08/16 Mammogram: 2/19, due Vision:  2018 Dental: overdue Wt Readings from Last 3 Encounters:  06/10/18 108 lb (49 kg)  05/23/18 110 lb (49.9 kg)  04/10/18 110 lb (49.9 kg)         Review of Systems  Constitutional: Negative for unexpected weight change.  HENT: Negative for hearing loss and rhinorrhea.   Eyes: Negative for visual disturbance.  Respiratory: Negative for cough.   Cardiovascular: Negative for chest pain.  Gastrointestinal: Negative for blood in stool, constipation and diarrhea.  Genitourinary: Negative for dysuria, frequency and hematuria.  Musculoskeletal: Negative for arthralgias and joint swelling.  Skin: Negative for rash.  Neurological:       Reports occasional HA' when she is fatigued  Hematological: Negative for adenopathy.  Psychiatric/Behavioral:       Denies depression/anxiety   Past Medical History:  Diagnosis Date  . Allergy   . Iron deficiency anemia   . Osteopenia   . Seasonal allergies      Social History   Socioeconomic History  . Marital status: Married    Spouse name: Not on file  . Number of children: Not on file  . Years of education: Not on file  . Highest education level: Not on file  Occupational History  . Not on file  Social Needs  . Financial resource strain: Not on file  . Food insecurity:    Worry: Not on file    Inability: Not on file  . Transportation needs:    Medical: Not on file    Non-medical: Not on file  Tobacco Use  . Smoking status: Never Smoker  . Smokeless tobacco: Never Used  Substance and Sexual Activity  . Alcohol use: No    Alcohol/week: 0.0 standard drinks  . Drug use: No  . Sexual activity:  Not on file  Lifestyle  . Physical activity:    Days per week: Not on file    Minutes per session: Not on file  . Stress: Not on file  Relationships  . Social connections:    Talks on phone: Not on file    Gets together: Not on file    Attends religious service: Not on file    Active member of club or organization: Not on file    Attends meetings of clubs or organizations: Not on file    Relationship status: Not on file  . Intimate partner violence:    Fear of current or ex partner: Not on file    Emotionally abused: Not on file    Physically abused: Not on file    Forced sexual activity: Not on file  Other Topics Concern  . Not on file  Social History Narrative   Works in [place]   Married   2 sons.   Grew up in [place].        Past Surgical History:  Procedure Laterality Date  . miscarriage      Family History  Problem Relation Age of Onset  . Colon cancer Neg Hx     No Known Allergies  Current Outpatient Medications on File Prior to Visit  Medication Sig Dispense Refill  . Cholecalciferol (VITAMIN D3)  3000 units TABS Take 1 tablet by mouth daily. 30 tablet   . ferrous sulfate 325 (65 FE) MG tablet Take 325 mg by mouth 2 (two) times daily.      . Multiple Vitamins-Minerals (ONE DAILY MULTIVITAMIN WOMEN PO) Take 1 tablet by mouth daily.    . Omega-3 Fatty Acids (FISH OIL) 1000 MG CPDR Take 1 capsule by mouth daily.       No current facility-administered medications on file prior to visit.     BP 117/68 (BP Location: Right Arm, Patient Position: Sitting, Cuff Size: Small)   Pulse 64   Temp 98.6 F (37 C) (Oral)   Resp 16   Ht 5' (1.524 m)   Wt 108 lb (49 kg)   SpO2 100%   BMI 21.09 kg/m       Objective:   Physical Exam Physical Exam  Constitutional: She is oriented to person, place, and time. She appears well-developed and well-nourished. No distress.  HENT:  Head: Normocephalic and atraumatic.  Right Ear: Tympanic membrane and ear canal normal.    Left Ear: Tympanic membrane and ear canal normal.  Mouth/Throat: Oropharynx is clear and moist.  Eyes: Pupils are equal, round, and reactive to light. No scleral icterus.  Neck: Normal range of motion. No thyromegaly present.  Cardiovascular: Normal rate and regular rhythm.   No murmur heard. Pulmonary/Chest: Effort normal and breath sounds normal. No respiratory distress. He has no wheezes. She has no rales. She exhibits no tenderness.  Abdominal: Soft. Bowel sounds are normal. She exhibits no distension and no mass. There is no tenderness. There is no rebound and no guarding.  Musculoskeletal: She exhibits no edema.  Lymphadenopathy:    She has no cervical adenopathy.  Neurological: She is alert and oriented to person, place, and time. She has normal patellar reflexes. She exhibits normal muscle tone. Coordination normal.  Skin: Skin is warm and dry.  Psychiatric: She has a normal mood and affect. Her behavior is normal. Judgment and thought content normal.  Breasts: Examined lying Right: Without masses, retractions, discharge or axillary adenopathy.  Left: Without masses, retractions, discharge or axillary adenopathy.  Pelvic: deferred         Assessment & Plan:   Preventative care- flu and tetanus up to date. Due for Shingrix #1 today. Refer for mammogram. Pap and colo up to date.  EKG tracing is personally reviewed.  EKG notes NSR.  No acute changes.        Assessment & Plan:

## 2018-06-10 NOTE — Patient Instructions (Addendum)
Please schedule routine dental exam   Try to increase walking to 5 days a week. Continue healthy diet. Schedule mammogram on the first floor. Complete lab work prior to leaving.

## 2018-06-23 ENCOUNTER — Ambulatory Visit (INDEPENDENT_AMBULATORY_CARE_PROVIDER_SITE_OTHER): Payer: Managed Care, Other (non HMO) | Admitting: Family Medicine

## 2018-06-23 ENCOUNTER — Encounter: Payer: Self-pay | Admitting: Family Medicine

## 2018-06-23 VITALS — BP 138/72 | HR 72 | Ht 60.0 in | Wt 110.0 lb

## 2018-06-23 DIAGNOSIS — M79644 Pain in right finger(s): Secondary | ICD-10-CM | POA: Diagnosis not present

## 2018-06-23 NOTE — Progress Notes (Signed)
PCP: [name], NP  Subjective:   HPI: Patient is a 53 y.o. female here for right finger pain.  11/7: Interpreter line used for visit. Patient reports about 1 1/2 months ago she started to get pain and swelling, stiffness on palmar side of right index finger. No acute injury or trauma. Pain is 2/10 and difficulty trying to grip things. She is right handed. Tried icing, epsom salt soaks without much benefit. No skin changes, numbness.  12/5: Interpreter present for visit. Patient reports mild improvement since last visit. Diclofenac was helping but irritating her stomach. Has been wearing brace on finger. Pain level 2/10 and sore, hard to grip things. No numbness.  05/23/18: Interpreter present for visit. She reports pain is about the same as last visit. Took meloxicam - tolerated some better but didn't seem to help much. Has been splinting. Difficulty flexing her right index finger. No numbness.  2/17: Interpreter line used for visit. Patient reports she's doing much better without pain. No skin changes.  Past Medical History:  Diagnosis Date  . Allergy   . Iron deficiency anemia   . Osteopenia   . Seasonal allergies     Current Outpatient Medications on File Prior to Visit  Medication Sig Dispense Refill  . Cholecalciferol (VITAMIN D3) 3000 units TABS Take 1 tablet by mouth daily. 30 tablet   . ferrous sulfate 325 (65 FE) MG tablet Take 325 mg by mouth 2 (two) times daily.      . Multiple Vitamins-Minerals (ONE DAILY MULTIVITAMIN WOMEN PO) Take 1 tablet by mouth daily.    . Omega-3 Fatty Acids (FISH OIL) 1000 MG CPDR Take 1 capsule by mouth daily.       No current facility-administered medications on file prior to visit.     Past Surgical History:  Procedure Laterality Date  . miscarriage      No Known Allergies  Social History   Socioeconomic History  . Marital status: Married    Spouse name: Not on file  . Number of children: Not on file   . Years of education: Not on file  . Highest education level: Not on file  Occupational History  . Not on file  Social Needs  . Financial resource strain: Not on file  . Food insecurity:    Worry: Not on file    Inability: Not on file  . Transportation needs:    Medical: Not on file    Non-medical: Not on file  Tobacco Use  . Smoking status: Never Smoker  . Smokeless tobacco: Never Used  Substance and Sexual Activity  . Alcohol use: No    Alcohol/week: 0.0 standard drinks  . Drug use: No  . Sexual activity: Not on file  Lifestyle  . Physical activity:    Days per week: Not on file    Minutes per session: Not on file  . Stress: Not on file  Relationships  . Social connections:    Talks on phone: Not on file    Gets together: Not on file    Attends religious service: Not on file    Active member of club or organization: Not on file    Attends meetings of clubs or organizations: Not on file    Relationship status: Not on file  . Intimate partner violence:    Fear of current or ex partner: Not on file    Emotionally abused: Not on file    Physically abused: Not on file    Forced sexual  activity: Not on file  Other Topics Concern  . Not on file  Social History Narrative   Works in [place]   Married   2 sons.   Grew up in [place].        Family History  Problem Relation Age of Onset  . Colon cancer Neg Hx     BP 138/72   Pulse 72   Ht 5' (1.524 m)   Wt 110 lb (49.9 kg)   BMI 21.48 kg/m   Review of Systems: See HPI above.     Objective:  Physical Exam:  Gen: NAD, comfortable in exam room  Right 2nd digit: No deformity. FROM with 5/5 strength MCP, PIP, DIP joints. No tenderness to palpation. Collateral ligaments intact. NVI distally.  Assessment & Plan:  1. Right 2nd digit flexor tenosynovitis - Resolved with tendon sheath injection.  F/u prn.

## 2018-07-07 ENCOUNTER — Ambulatory Visit (HOSPITAL_BASED_OUTPATIENT_CLINIC_OR_DEPARTMENT_OTHER)
Admission: RE | Admit: 2018-07-07 | Discharge: 2018-07-07 | Disposition: A | Discharge status: Home / Self Care | Payer: Managed Care, Other (non HMO) | Source: Ambulatory Visit | Attending: Family | Admitting: Family

## 2018-07-07 DIAGNOSIS — Z0000 Encounter for general adult medical examination without abnormal findings: Secondary | ICD-10-CM

## 2018-07-07 DIAGNOSIS — Z1231 Encounter for screening mammogram for malignant neoplasm of breast: Secondary | ICD-10-CM | POA: Diagnosis present

## 2018-08-13 ENCOUNTER — Ambulatory Visit: Payer: Managed Care, Other (non HMO)

## 2018-09-11 ENCOUNTER — Other Ambulatory Visit: Payer: Self-pay

## 2018-09-11 ENCOUNTER — Ambulatory Visit (INDEPENDENT_AMBULATORY_CARE_PROVIDER_SITE_OTHER): Payer: Managed Care, Other (non HMO)

## 2018-09-11 DIAGNOSIS — Z23 Encounter for immunization: Secondary | ICD-10-CM | POA: Diagnosis not present

## 2019-06-15 ENCOUNTER — Encounter: Payer: Managed Care, Other (non HMO) | Admitting: Family

## 2019-06-17 ENCOUNTER — Other Ambulatory Visit (INDEPENDENT_AMBULATORY_CARE_PROVIDER_SITE_OTHER): Payer: Managed Care, Other (non HMO)

## 2019-06-17 ENCOUNTER — Encounter: Payer: Self-pay | Admitting: Family

## 2019-06-17 ENCOUNTER — Other Ambulatory Visit: Payer: Self-pay

## 2019-06-17 ENCOUNTER — Other Ambulatory Visit (HOSPITAL_COMMUNITY)
Admission: RE | Admit: 2019-06-17 | Discharge: 2019-06-17 | Disposition: A | Discharge status: Home / Self Care | Payer: Managed Care, Other (non HMO) | Source: Ambulatory Visit | Attending: Family | Admitting: Family

## 2019-06-17 ENCOUNTER — Ambulatory Visit (INDEPENDENT_AMBULATORY_CARE_PROVIDER_SITE_OTHER): Payer: Managed Care, Other (non HMO) | Admitting: Family

## 2019-06-17 VITALS — BP 138/78 | HR 72 | Temp 96.2°F | Resp 16 | Ht 60.0 in | Wt 116.0 lb

## 2019-06-17 DIAGNOSIS — Z01419 Encounter for gynecological examination (general) (routine) without abnormal findings: Secondary | ICD-10-CM | POA: Diagnosis not present

## 2019-06-17 DIAGNOSIS — Z0000 Encounter for general adult medical examination without abnormal findings: Secondary | ICD-10-CM

## 2019-06-17 DIAGNOSIS — D649 Anemia, unspecified: Secondary | ICD-10-CM | POA: Diagnosis not present

## 2019-06-17 DIAGNOSIS — Z23 Encounter for immunization: Secondary | ICD-10-CM | POA: Diagnosis not present

## 2019-06-17 LAB — CBC WITH DIFFERENTIAL/PLATELET
Basophils Absolute: 0 10*3/uL (ref 0.0–0.1)
Basophils Relative: 0.5 % (ref 0.0–3.0)
Eosinophils Absolute: 0.2 10*3/uL (ref 0.0–0.7)
Eosinophils Relative: 4 % (ref 0.0–5.0)
HCT: 35.4 % — ABNORMAL LOW (ref 36.0–46.0)
Hemoglobin: 11.2 g/dL — ABNORMAL LOW (ref 12.0–15.0)
Lymphocytes Relative: 37 % (ref 12.0–46.0)
Lymphs Abs: 2.2 10*3/uL (ref 0.7–4.0)
MCHC: 31.6 g/dL (ref 30.0–36.0)
MCV: 79.2 fl (ref 78.0–100.0)
Monocytes Absolute: 0.4 10*3/uL (ref 0.1–1.0)
Monocytes Relative: 7.5 % (ref 3.0–12.0)
Neutro Abs: 3 10*3/uL (ref 1.4–7.7)
Neutrophils Relative %: 51 % (ref 43.0–77.0)
Platelets: 249 10*3/uL (ref 150.0–400.0)
RBC: 4.46 Mil/uL (ref 3.87–5.11)
RDW: 14.2 % (ref 11.5–15.5)
WBC: 5.9 10*3/uL (ref 4.0–10.5)

## 2019-06-17 LAB — IRON: Iron: 65 ug/dL (ref 42–145)

## 2019-06-17 LAB — BASIC METABOLIC PANEL
BUN: 17 mg/dL (ref 6–23)
CO2: 30 mEq/L (ref 19–32)
Calcium: 9.8 mg/dL (ref 8.4–10.5)
Chloride: 106 mEq/L (ref 96–112)
Creatinine, Ser: 0.75 mg/dL (ref 0.40–1.20)
GFR: 80.2 mL/min (ref >60.00)
Glucose, Bld: 95 mg/dL (ref 70–99)
Potassium: 4.7 mEq/L (ref 3.5–5.1)
Sodium: 141 mEq/L (ref 135–145)

## 2019-06-17 LAB — HEPATIC FUNCTION PANEL
ALT: 19 U/L (ref 0–35)
AST: 19 U/L (ref 0–37)
Albumin: 4.6 g/dL (ref 3.5–5.2)
Alkaline Phosphatase: 69 U/L (ref 39–117)
Bilirubin, Direct: 0.1 mg/dL (ref 0.0–0.3)
Total Bilirubin: 0.5 mg/dL (ref 0.2–1.2)
Total Protein: 7.2 g/dL (ref 6.0–8.3)

## 2019-06-17 LAB — LIPID PANEL
Cholesterol: 202 mg/dL — ABNORMAL HIGH (ref 0–200)
HDL: 58.6 mg/dL (ref >39.00)
LDL Cholesterol: 132 mg/dL — ABNORMAL HIGH (ref 0–99)
NonHDL: 143.14
Total CHOL/HDL Ratio: 3
Triglycerides: 58 mg/dL (ref 0.0–149.0)
VLDL: 11.6 mg/dL (ref 0.0–40.0)

## 2019-06-17 LAB — TSH: TSH: 0.89 u[IU]/mL (ref 0.35–4.50)

## 2019-06-17 LAB — FERRITIN: Ferritin: 70.2 ng/mL (ref 10.0–291.0)

## 2019-06-17 NOTE — Progress Notes (Signed)
Subjective:    Patient ID: Nicole Schneider, female    DOB: 09-23-1965, 54 y.o.   MRN: [id number]  HPI  Patient presents today for complete physical.  Immunizations: Tdap due today Diet:healthy Exercise: walks regularly Colonoscopy: 2017 Dexa:  2019 Pap Smear: due Mammogram: up to date Dental: due  Vision: Due     Review of Systems  Constitutional: Negative for unexpected weight change.  HENT: Negative for hearing loss and rhinorrhea.   Eyes: Negative for visual disturbance.  Respiratory: Negative for cough and shortness of breath.   Cardiovascular: Negative for chest pain.  Gastrointestinal: Positive for constipation. Negative for blood in stool.  Genitourinary: Negative for dysuria, frequency and hematuria.  Musculoskeletal: Negative for arthralgias.  Skin: Negative for rash.  Neurological: Negative for headaches.  Hematological: Negative for adenopathy.  Psychiatric/Behavioral:       Denies depression symptoms   Past Medical History:  Diagnosis Date  . Allergy   . Iron deficiency anemia   . Osteopenia   . Seasonal allergies      Social History   Socioeconomic History  . Marital status: Married    Spouse name: Not on file  . Number of children: Not on file  . Years of education: Not on file  . Highest education level: Not on file  Occupational History  . Not on file  Tobacco Use  . Smoking status: Never Smoker  . Smokeless tobacco: Never Used  Substance and Sexual Activity  . Alcohol use: No    Alcohol/week: 0.0 standard drinks  . Drug use: No  . Sexual activity: Not on file  Other Topics Concern  . Not on file  Social History Narrative   Works in [place]   Married   2 sons.   Grew up in [place].       Social Determinants of Health   Financial Resource Strain:   . Difficulty of Paying Living Expenses: Not on file  Food Insecurity:   . Worried About Charity fundraiser in the Last Year: Not on file  . Ran Out of Food in the Last Year: Not on  file  Transportation Needs:   . Lack of Transportation (Medical): Not on file  . Lack of Transportation (Non-Medical): Not on file  Physical Activity:   . Days of Exercise per Week: Not on file  . Minutes of Exercise per Session: Not on file  Stress:   . Feeling of Stress : Not on file  Social Connections:   . Frequency of Communication with Friends and Family: Not on file  . Frequency of Social Gatherings with Friends and Family: Not on file  . Attends Religious Services: Not on file  . Active Member of Clubs or Organizations: Not on file  . Attends Archivist Meetings: Not on file  . Marital Status: Not on file  Intimate Partner Violence:   . Fear of Current or Ex-Partner: Not on file  . Emotionally Abused: Not on file  . Physically Abused: Not on file  . Sexually Abused: Not on file    Past Surgical History:  Procedure Laterality Date  . miscarriage      Family History  Problem Relation Age of Onset  . Colon cancer Neg Hx     No Known Allergies  Current Outpatient Medications on File Prior to Visit  Medication Sig Dispense Refill  . Cholecalciferol (VITAMIN D3) 3000 units TABS Take 1 tablet by mouth daily. 30 tablet   . ferrous sulfate 325 (  65 FE) MG tablet Take 325 mg by mouth 2 (two) times daily.      . Multiple Vitamins-Minerals (ONE DAILY MULTIVITAMIN WOMEN PO) Take 1 tablet by mouth daily.    . Omega-3 Fatty Acids (FISH OIL) 1000 MG CPDR Take 1 capsule by mouth daily.       No current facility-administered medications on file prior to visit.    BP 138/78 (BP Location: Right Arm, Patient Position: Sitting, Cuff Size: Small)   Pulse 72   Temp (!) 96.2 F (35.7 C) (Temporal)   Resp 16   Ht 5' (1.524 m)   Wt 116 lb (52.6 kg)   SpO2 100%   BMI 22.65 kg/m       Objective:   Physical Exam  Physical Exam  Constitutional: She is oriented to person, place, and time. She appears well-developed and well-nourished. No distress.  HENT:  Head:  Normocephalic and atraumatic.  Right Ear: Tympanic membrane and ear canal normal.  Left Ear: Tympanic membrane and ear canal normal.  Mouth/Throat: not examined- pt wearing mask Eyes: Pupils are equal, round, and reactive to light. No scleral icterus.  Neck: Normal range of motion. No thyromegaly present.  Cardiovascular: Normal rate and regular rhythm.   No murmur heard. Pulmonary/Chest: Effort normal and breath sounds normal. No respiratory distress. He has no wheezes. She has no rales. She exhibits no tenderness.  Abdominal: Soft. Bowel sounds are normal. She exhibits no distension and no mass. There is no tenderness. There is no rebound and no guarding.  Musculoskeletal: She exhibits no edema.  Lymphadenopathy:    She has no cervical adenopathy.  Neurological: She is alert and oriented to person, place, and time. She has normal patellar reflexes. She exhibits normal muscle tone. Coordination normal.  Skin: Skin is warm and dry.  Psychiatric: She has a normal mood and affect. Her behavior is normal. Judgment and thought content normal.  Breasts: Examined lying Right: Without masses, retractions, discharge or axillary adenopathy.  Left: Without masses, retractions, discharge or axillary adenopathy.  Inguinal/mons: Normal without inguinal adenopathy  External genitalia: Normal  BUS/Urethra/Skene's glands: Normal  Bladder: Normal  Vagina: Normal  Cervix: Normal  Uterus: normal in size, shape and contour. Midline and mobile  Adnexa/parametria:  Rt: Without masses or tenderness.  Lt: Without masses or tenderness.  Anus and perineum: Normal            Assessment & Plan:  Preventative care- discussed healthy diet, exercise.  Weight is at goal.  Pap performed today.  Tdap today, refer for mammogram. Obtain routine lab work.   Constipation- recommended pt increase fresh produce/water intake and use otc miralax as needed.       Assessment & Plan:

## 2019-06-17 NOTE — Patient Instructions (Addendum)
Please schedule routine eye exam and dental visit. For constipation- make sure to drink 8 glasses of water a day and get 6 servings of fresh fruit or vegetables/day If needed, you may use Miralax powder (one capful in 8 oz of water)once daily as needed.

## 2019-06-18 ENCOUNTER — Encounter: Payer: Self-pay | Admitting: Family

## 2019-06-18 LAB — CYTOLOGY - PAP
Comment: NEGATIVE
Diagnosis: NEGATIVE
High risk HPV: NEGATIVE

## 2019-07-07 ENCOUNTER — Other Ambulatory Visit: Payer: Self-pay

## 2019-07-07 ENCOUNTER — Telehealth: Payer: Self-pay | Admitting: Family

## 2019-07-07 ENCOUNTER — Ambulatory Visit (HOSPITAL_BASED_OUTPATIENT_CLINIC_OR_DEPARTMENT_OTHER)
Admission: RE | Admit: 2019-07-07 | Discharge: 2019-07-07 | Disposition: A | Discharge status: Home / Self Care | Payer: Managed Care, Other (non HMO) | Source: Ambulatory Visit | Attending: Family | Admitting: Family

## 2019-07-07 DIAGNOSIS — Z1231 Encounter for screening mammogram for malignant neoplasm of breast: Secondary | ICD-10-CM | POA: Insufficient documentation

## 2019-07-07 DIAGNOSIS — Z0000 Encounter for general adult medical examination without abnormal findings: Secondary | ICD-10-CM

## 2019-07-07 NOTE — Telephone Encounter (Signed)
Please let pt know that the radiologist would like her to complete some additional breast images for further evaluation. Let me know if she has not been contacted by them about a follow up appointment in 1 week.   

## 2019-07-08 NOTE — Telephone Encounter (Signed)
Called a few times but no answer, lvm for her to be aware she will receive a call from radiology about getting additional immages.

## 2019-07-09 ENCOUNTER — Other Ambulatory Visit: Payer: Self-pay | Admitting: Family

## 2019-07-09 DIAGNOSIS — R928 Other abnormal and inconclusive findings on diagnostic imaging of breast: Secondary | ICD-10-CM

## 2019-07-29 ENCOUNTER — Other Ambulatory Visit: Payer: Self-pay

## 2019-07-29 ENCOUNTER — Ambulatory Visit: Payer: Managed Care, Other (non HMO)

## 2019-07-29 ENCOUNTER — Ambulatory Visit
Admission: RE | Admit: 2019-07-29 | Discharge: 2019-07-29 | Disposition: A | Discharge status: Home / Self Care | Payer: Managed Care, Other (non HMO) | Source: Ambulatory Visit | Attending: Family | Admitting: Family

## 2019-07-29 DIAGNOSIS — R928 Other abnormal and inconclusive findings on diagnostic imaging of breast: Secondary | ICD-10-CM

## 2020-06-24 ENCOUNTER — Ambulatory Visit (INDEPENDENT_AMBULATORY_CARE_PROVIDER_SITE_OTHER): Payer: No Typology Code available for payment source | Admitting: Family

## 2020-06-24 ENCOUNTER — Encounter: Payer: Self-pay | Admitting: Family

## 2020-06-24 ENCOUNTER — Other Ambulatory Visit: Payer: Self-pay

## 2020-06-24 VITALS — BP 140/71 | HR 78 | Temp 98.8°F | Resp 16 | Ht 60.0 in | Wt 112.8 lb

## 2020-06-24 DIAGNOSIS — Z23 Encounter for immunization: Secondary | ICD-10-CM | POA: Diagnosis not present

## 2020-06-24 DIAGNOSIS — Z1159 Encounter for screening for other viral diseases: Secondary | ICD-10-CM

## 2020-06-24 DIAGNOSIS — E785 Hyperlipidemia, unspecified: Secondary | ICD-10-CM

## 2020-06-24 DIAGNOSIS — R198 Other specified symptoms and signs involving the digestive system and abdomen: Secondary | ICD-10-CM | POA: Diagnosis not present

## 2020-06-24 DIAGNOSIS — R0989 Other specified symptoms and signs involving the circulatory and respiratory systems: Secondary | ICD-10-CM

## 2020-06-24 DIAGNOSIS — D509 Iron deficiency anemia, unspecified: Secondary | ICD-10-CM | POA: Diagnosis not present

## 2020-06-24 DIAGNOSIS — M8580 Other specified disorders of bone density and structure, unspecified site: Secondary | ICD-10-CM

## 2020-06-24 DIAGNOSIS — Z0000 Encounter for general adult medical examination without abnormal findings: Secondary | ICD-10-CM | POA: Diagnosis not present

## 2020-06-24 DIAGNOSIS — Z114 Encounter for screening for human immunodeficiency virus [HIV]: Secondary | ICD-10-CM

## 2020-06-24 DIAGNOSIS — R09A2 Foreign body sensation, throat: Secondary | ICD-10-CM

## 2020-06-24 LAB — CBC WITH DIFFERENTIAL/PLATELET
Basophils Absolute: 0 10*3/uL (ref 0.0–0.1)
Basophils Relative: 0.7 % (ref 0.0–3.0)
Eosinophils Absolute: 0.1 10*3/uL (ref 0.0–0.7)
Eosinophils Relative: 2.7 % (ref 0.0–5.0)
HCT: 36.2 % (ref 36.0–46.0)
Hemoglobin: 11.4 g/dL — ABNORMAL LOW (ref 12.0–15.0)
Lymphocytes Relative: 40.1 % (ref 12.0–46.0)
Lymphs Abs: 1.8 10*3/uL (ref 0.7–4.0)
MCHC: 31.6 g/dL (ref 30.0–36.0)
MCV: 78.9 fl (ref 78.0–100.0)
Monocytes Absolute: 0.3 10*3/uL (ref 0.1–1.0)
Monocytes Relative: 7.9 % (ref 3.0–12.0)
Neutro Abs: 2.1 10*3/uL (ref 1.4–7.7)
Neutrophils Relative %: 48.6 % (ref 43.0–77.0)
Platelets: 221 10*3/uL (ref 150.0–400.0)
RBC: 4.58 Mil/uL (ref 3.87–5.11)
RDW: 14.5 % (ref 11.5–15.5)
WBC: 4.4 10*3/uL (ref 4.0–10.5)

## 2020-06-24 LAB — LIPID PANEL
Cholesterol: 223 mg/dL — ABNORMAL HIGH (ref 0–200)
HDL: 64.1 mg/dL (ref >39.00)
LDL Cholesterol: 141 mg/dL — ABNORMAL HIGH (ref 0–99)
NonHDL: 159.23
Total CHOL/HDL Ratio: 3
Triglycerides: 92 mg/dL (ref 0.0–149.0)
VLDL: 18.4 mg/dL (ref 0.0–40.0)

## 2020-06-24 LAB — COMPREHENSIVE METABOLIC PANEL
ALT: 17 U/L (ref 0–35)
AST: 19 U/L (ref 0–37)
Albumin: 4.6 g/dL (ref 3.5–5.2)
Alkaline Phosphatase: 79 U/L (ref 39–117)
BUN: 19 mg/dL (ref 6–23)
CO2: 31 mEq/L (ref 19–32)
Calcium: 10 mg/dL (ref 8.4–10.5)
Chloride: 104 mEq/L (ref 96–112)
Creatinine, Ser: 0.8 mg/dL (ref 0.40–1.20)
GFR: 82.53 mL/min (ref >60.00)
Glucose, Bld: 91 mg/dL (ref 70–99)
Potassium: 4.7 mEq/L (ref 3.5–5.1)
Sodium: 141 mEq/L (ref 135–145)
Total Bilirubin: 0.8 mg/dL (ref 0.2–1.2)
Total Protein: 7.4 g/dL (ref 6.0–8.3)

## 2020-06-24 LAB — IRON: Iron: 111 ug/dL (ref 42–145)

## 2020-06-24 LAB — FERRITIN: Ferritin: 70.4 ng/mL (ref 10.0–291.0)

## 2020-06-24 MED ORDER — OMEPRAZOLE 40 MG PO CPDR: 40.0000 mg | DELAYED_RELEASE_CAPSULE | Freq: Every day | ORAL | 3 refills | Status: DC

## 2020-06-24 NOTE — Progress Notes (Signed)
Subjective:    Patient ID: Nicole Schneider, female    DOB: 12-08-1965, 55 y.o.   MRN: [id number]  HPI   Video Interpretor Stratus used today: [facility] #[id number]  Patient presents today for complete physical.  Immunizations: would like flu shot today  Diet:  She reports that her diet healthy Wt Readings from Last 3 Encounters:  06/24/20 112 lb 12.8 oz (51.2 kg)  06/17/19 116 lb (52.6 kg)  06/23/18 110 lb (49.9 kg)    Exercise:  Yes, 3 times a week Colonoscopy: 2017- due 2027 Dexa: 2019 Pap Smear:06/17/2019 Mammogram: 07/07/19 Vision: up to date Dental: will schedule.    A Burmese interpretor was utilized for today's visit via video.     Review of Systems  Constitutional: Negative for unexpected weight change.  HENT: Negative for hearing loss and rhinorrhea.   Eyes: Negative for visual disturbance.  Respiratory: Negative for cough and shortness of breath.   Cardiovascular: Negative for chest pain.  Gastrointestinal: Negative for constipation and diarrhea.       She does report feeling like something is stuck in her esophagus some days.  Genitourinary: Negative for dysuria and frequency.  Musculoskeletal: Negative for arthralgias and myalgias.  Skin: Negative for rash.  Neurological: Positive for headaches (rarely).  Hematological: Negative for adenopathy.  Psychiatric/Behavioral:       Denies depression/anxiety    Past Medical History:  Diagnosis Date  . Allergy   . Iron deficiency anemia   . Osteopenia   . Seasonal allergies      Social History   Socioeconomic History  . Marital status: Married    Spouse name: Not on file  . Number of children: Not on file  . Years of education: Not on file  . Highest education level: Not on file  Occupational History  . Not on file  Tobacco Use  . Smoking status: Never Smoker  . Smokeless tobacco: Never Used  Substance and Sexual Activity  . Alcohol use: No    Alcohol/week: 0.0 standard drinks  . Drug use: No  . Sexual  activity: Not on file  Other Topics Concern  . Not on file  Social History Narrative   Works in [place]   Married   2 sons.   Grew up in [place].       Social Determinants of Health   Financial Resource Strain: Not on file  Food Insecurity: Not on file  Transportation Needs: Not on file  Physical Activity: Not on file  Stress: Not on file  Social Connections: Not on file  Intimate Partner Violence: Not on file    Past Surgical History:  Procedure Laterality Date  . miscarriage      Family History  Problem Relation Age of Onset  . Colon cancer Neg Hx     No Known Allergies  Current Outpatient Medications on File Prior to Visit  Medication Sig Dispense Refill  . Cholecalciferol (VITAMIN D3) 3000 units TABS Take 1 tablet by mouth daily. 30 tablet   . ferrous sulfate 325 (65 FE) MG tablet Take 325 mg by mouth 2 (two) times daily.      . Multiple Vitamins-Minerals (ONE DAILY MULTIVITAMIN WOMEN PO) Take 1 tablet by mouth daily.    . Omega-3 Fatty Acids (FISH OIL) 1000 MG CPDR Take 1 capsule by mouth daily.       No current facility-administered medications on file prior to visit.    BP 140/71 (BP Location: Right Arm, Patient Position: Sitting, Cuff Size: Small)  Pulse 78   Temp 98.8 F (37.1 C) (Oral)   Resp 16   Ht 5' (1.524 m)   Wt 112 lb 12.8 oz (51.2 kg)   SpO2 100%   BMI 22.03 kg/m    Objective:   Physical Exam  Physical Exam  Constitutional: She is oriented to person, place, and time. She appears well-developed and well-nourished. No distress.  HENT:  Head: Normocephalic and atraumatic.  Right Ear: Tympanic membrane and ear canal normal.  Left Ear: Tympanic membrane and ear canal normal.  Mouth/Throat:Not examined- pt wearing mask Eyes: Pupils are equal, round, and reactive to light. No scleral icterus.  Neck: Normal range of motion. No thyromegaly present.  Cardiovascular: Normal rate and regular rhythm.   No murmur heard. Pulmonary/Chest: Effort  normal and breath sounds normal. No respiratory distress. He has no wheezes. She has no rales. She exhibits no tenderness.  Abdominal: Soft. Bowel sounds are normal. She exhibits no distension and no mass. There is no tenderness. There is no rebound and no guarding.  Musculoskeletal: She exhibits no edema.  Lymphadenopathy:    She has no cervical adenopathy.  Neurological: She is alert and oriented to person, place, and time. She has normal patellar reflexes. She exhibits normal muscle tone. Coordination normal.  Skin: Skin is warm and dry.  Psychiatric: She has a normal mood and affect. Her behavior is normal. Judgment and thought content normal.  Breasts: Examined lying Right: Without masses, retractions, discharge or axillary adenopathy.  Left: Without masses, retractions, discharge or axillary adenopathy.  Pelvic: deferred          Assessment & Plan:    Preventative care- discussed healthy diet, exercise.  Obtain labs as ordered. Refer for mammogram, bone density. Colo up to date.  Flu shot today. Check HIV and Hep C.    Globus sensation- new. Will rx with trial of omeprazole 40mg  once daily. Follow up in 1 month.  This visit occurred during the SARS-CoV-2 public health emergency.  Safety protocols were in place, including screening questions prior to the visit, additional usage of staff PPE, and extensive cleaning of exam room while observing appropriate contact time as indicated for disinfecting solutions.        Assessment & Plan:

## 2020-06-24 NOTE — Patient Instructions (Addendum)
Please start omeprazole once daily for acid reflux symptoms. Complete lab work prior to leaving.   You should be contacted about scheduling mammogram and bone density.

## 2020-06-27 LAB — HEPATITIS C ANTIBODY
Hepatitis C Ab: NONREACTIVE
SIGNAL TO CUT-OFF: 0.6 (ref <1.00)

## 2020-06-27 LAB — HIV ANTIBODY (ROUTINE TESTING W REFLEX): HIV 1&2 Ab, 4th Generation: NONREACTIVE

## 2020-06-28 ENCOUNTER — Encounter: Payer: Self-pay | Admitting: Family

## 2020-06-30 NOTE — Progress Notes (Signed)
Mailed out to patient 

## 2020-07-07 ENCOUNTER — Ambulatory Visit (HOSPITAL_BASED_OUTPATIENT_CLINIC_OR_DEPARTMENT_OTHER)
Admission: RE | Admit: 2020-07-07 | Discharge: 2020-07-07 | Disposition: A | Discharge status: Home / Self Care | Payer: No Typology Code available for payment source | Source: Ambulatory Visit | Attending: Family | Admitting: Family

## 2020-07-07 ENCOUNTER — Other Ambulatory Visit: Payer: Self-pay

## 2020-07-07 DIAGNOSIS — Z0000 Encounter for general adult medical examination without abnormal findings: Secondary | ICD-10-CM | POA: Insufficient documentation

## 2020-07-07 DIAGNOSIS — M8580 Other specified disorders of bone density and structure, unspecified site: Secondary | ICD-10-CM | POA: Insufficient documentation

## 2020-07-12 ENCOUNTER — Encounter: Payer: Self-pay | Admitting: Family

## 2020-07-12 NOTE — Progress Notes (Signed)
Mailed out to pt 

## 2020-07-22 ENCOUNTER — Encounter: Payer: Self-pay | Admitting: Family

## 2020-07-22 ENCOUNTER — Other Ambulatory Visit: Payer: Self-pay

## 2020-07-22 ENCOUNTER — Ambulatory Visit: Payer: No Typology Code available for payment source | Admitting: Family

## 2020-07-22 VITALS — BP 119/70 | HR 75 | Temp 98.7°F | Resp 16 | Ht 60.0 in | Wt 112.0 lb

## 2020-07-22 DIAGNOSIS — K219 Gastro-esophageal reflux disease without esophagitis: Secondary | ICD-10-CM

## 2020-07-22 MED ORDER — OMEPRAZOLE 40 MG PO CPDR: 40.0000 mg | DELAYED_RELEASE_CAPSULE | Freq: Every day | ORAL | 1 refills | Status: DC

## 2020-07-22 NOTE — Patient Instructions (Signed)
Please continue omeprazole once daily. Let me know if you develop frequent discomfort in the back of your throat.    Food Choices for Gastroesophageal Reflux Disease, Adult When you have gastroesophageal reflux disease (GERD), the foods you eat and your eating habits are very important. Choosing the right foods can help ease your discomfort. Think about working with a food expert (dietitian) to help you make good choices. What are tips for following this plan? Reading food labels  Look for foods that are low in saturated fat. Foods that may help with your symptoms include: ? Foods that have less than 5% of daily value (DV) of fat. ? Foods that have 0 grams of trans fat. Cooking  Do not fry your food.  Cook your food by baking, steaming, grilling, or broiling. These are all methods that do not need a lot of fat for cooking.  To add flavor, try to use herbs that are low in spice and acidity. Meal planning  Choose healthy foods that are low in fat, such as: ? Fruits and vegetables. ? Whole grains. ? Low-fat dairy products. ? Lean meats, fish, and poultry.  Eat small meals often instead of eating 3 large meals each day. Eat your meals slowly in a place where you are relaxed. Avoid bending over or lying down until 2-3 hours after eating.  Limit high-fat foods such as fatty meats or fried foods.  Limit your intake of fatty foods, such as oils, butter, and shortening.  Avoid the following as told by your doctor: ? Foods that cause symptoms. These may be different for different people. Keep a food diary to keep track of foods that cause symptoms. ? Alcohol. ? Drinking a lot of liquid with meals. ? Eating meals during the 2-3 hours before bed.   Lifestyle  Stay at a healthy weight. Ask your doctor what weight is healthy for you. If you need to lose weight, work with your doctor to do so safely.  Exercise for at least 30 minutes on 5 or more days each week, or as told by your  doctor.  Wear loose-fitting clothes.  Do not smoke or use any products that contain nicotine or tobacco. If you need help quitting, ask your doctor.  Sleep with the head of your bed higher than your feet. Use a wedge under the mattress or blocks under the bed frame to raise the head of the bed.  Chew sugar-free gum after meals. What foods should eat? Eat a healthy, well-balanced diet of fruits, vegetables, whole grains, low-fat dairy products, lean meats, fish, and poultry. Each person is different. Foods that may cause symptoms in one person may not cause any symptoms in another person. Work with your doctor to find foods that are safe for you. The items listed above may not be a complete list of what you can eat and drink. Contact a food expert for more options.   What foods should I avoid? Limiting some of these foods may help in managing the symptoms of GERD. Everyone is different. Talk with a food expert or your doctor to help you find the exact foods to avoid, if any. Fruits Any fruits prepared with added fat. Any fruits that cause symptoms. For some people, this may include citrus fruits, such as oranges, grapefruit, pineapple, and lemons. Vegetables Deep-fried vegetables. [place] fries. Any vegetables prepared with added fat. Any vegetables that cause symptoms. For some people, this may include tomatoes and tomato products, chili peppers, onions and garlic,  and horseradish. Grains Pastries or quick breads with added fat. Meats and other proteins High-fat meats, such as fatty beef or pork, hot dogs, ribs, ham, sausage, salami, and bacon. Fried meat or protein, including fried fish and fried chicken. Nuts and nut butters, in large amounts. Dairy Whole milk and chocolate milk. Sour cream. Cream. Ice cream. Cream cheese. Milkshakes. Fats and oils Butter. Margarine. Shortening. Ghee. Beverages Coffee and tea, with or without caffeine. Carbonated beverages. Sodas. Energy drinks. Fruit  juice made with acidic fruits, such as orange or grapefruit. Tomato juice. Alcoholic drinks. Sweets and desserts Chocolate and cocoa. Donuts. Seasonings and condiments Pepper. Peppermint and spearmint. Added salt. Any condiments, herbs, or seasonings that cause symptoms. For some people, this may include curry, hot sauce, or vinegar-based salad dressings. The items listed above may not be a complete list of what you should not eat and drink. Contact a food expert for more options. Questions to ask your doctor Diet and lifestyle changes are often the first steps that are taken to manage symptoms of GERD. If diet and lifestyle changes do not help, talk with your doctor about taking medicines. Where to find more information  International Foundation for Gastrointestinal Disorders: aboutgerd.org Summary  When you have GERD, food and lifestyle choices are very important in easing your symptoms.  Eat small meals often instead of 3 large meals a day. Eat your meals slowly and in a place where you are relaxed.  Avoid bending over or lying down until 2-3 hours after eating.  Limit high-fat foods such as fatty meats or fried foods. This information is not intended to replace advice given to you by your health care provider. Make sure you discuss any questions you have with your health care provider. Document Revised: 11/02/2019 Document Reviewed: 11/02/2019 Elsevier Patient Education  [place].

## 2020-07-22 NOTE — Progress Notes (Signed)
Subjective:    Patient ID: Nicole Schneider, female    DOB: 08-02-1965, 55 y.o.   MRN: [id number]  HPI  Pt is a 55 yr old female who presents today for follow up. Last visit she described a globus sensation.  We placed on a trial of omeprazole 40mg  once daily.   She reports that since she started omeprazole, her symptoms have improved significantly. She only noted one day since last visit when she had throat fullness and she attributed this to dietary indiscretion.    Review of Systems See HPI  Past Medical History:  Diagnosis Date  . Allergy   . Iron deficiency anemia   . Osteopenia   . Seasonal allergies      Social History   Socioeconomic History  . Marital status: Married    Spouse name: Not on file  . Number of children: Not on file  . Years of education: Not on file  . Highest education level: Not on file  Occupational History  . Not on file  Tobacco Use  . Smoking status: Never Smoker  . Smokeless tobacco: Never Used  Substance and Sexual Activity  . Alcohol use: No    Alcohol/week: 0.0 standard drinks  . Drug use: No  . Sexual activity: Not on file  Other Topics Concern  . Not on file  Social History Narrative   Works in [place]   Married   2 sons.   Grew up in [place].       Social Determinants of Health   Financial Resource Strain: Not on file  Food Insecurity: Not on file  Transportation Needs: Not on file  Physical Activity: Not on file  Stress: Not on file  Social Connections: Not on file  Intimate Partner Violence: Not on file    Past Surgical History:  Procedure Laterality Date  . miscarriage      Family History  Problem Relation Age of Onset  . Colon cancer Neg Hx     No Known Allergies  Current Outpatient Medications on File Prior to Visit  Medication Sig Dispense Refill  . Cholecalciferol (VITAMIN D3) 3000 units TABS Take 1 tablet by mouth daily. 30 tablet   . ferrous sulfate 325 (65 FE) MG tablet Take 325 mg by mouth 2 (two)  times daily.    . Multiple Vitamins-Minerals (ONE DAILY MULTIVITAMIN WOMEN PO) Take 1 tablet by mouth daily.    . Omega-3 Fatty Acids (FISH OIL) 1000 MG CPDR Take 1 capsule by mouth daily.    Marland Kitchen omeprazole (PRILOSEC) 40 MG capsule Take 1 capsule (40 mg total) by mouth daily. 30 capsule 3   No current facility-administered medications on file prior to visit.    BP 119/70 (BP Location: Right Arm, Patient Position: Sitting, Cuff Size: Small)   Pulse 75   Temp 98.7 F (37.1 C) (Oral)   Resp 16   Ht 5' (1.524 m)   Wt 112 lb (50.8 kg)   SpO2 100%   BMI 21.87 kg/m       Objective:   Physical Exam Constitutional:      Appearance: She is well-developed.  Neck:     Thyroid: No thyroid mass or thyromegaly.  Cardiovascular:     Rate and Rhythm: Normal rate and regular rhythm.     Heart sounds: Normal heart sounds. No murmur heard.   Pulmonary:     Effort: Pulmonary effort is normal. No respiratory distress.     Breath sounds: Normal breath sounds. No  wheezing.  Musculoskeletal:     Cervical back: Neck supple.  Lymphadenopathy:     Cervical: No cervical adenopathy.  Psychiatric:        Behavior: Behavior normal.        Thought Content: Thought content normal.        Judgment: Judgment normal.           Assessment & Plan:  GERD- suspect globus sensation was related to GERD. She is improved on omeprazole. Will continue same. Pt was given handout on GERD diet.  She is advised to call me symptoms worsen again.  This visit occurred during the SARS-CoV-2 public health emergency.  Safety protocols were in place, including screening questions prior to the visit, additional usage of staff PPE, and extensive cleaning of exam room while observing appropriate contact time as indicated for disinfecting solutions.

## 2020-09-30 ENCOUNTER — Encounter: Payer: Self-pay | Admitting: Family

## 2020-09-30 ENCOUNTER — Other Ambulatory Visit (HOSPITAL_BASED_OUTPATIENT_CLINIC_OR_DEPARTMENT_OTHER): Payer: Self-pay

## 2020-09-30 ENCOUNTER — Other Ambulatory Visit: Payer: Self-pay

## 2020-09-30 ENCOUNTER — Ambulatory Visit: Payer: No Typology Code available for payment source | Admitting: Family

## 2020-09-30 VITALS — BP 126/67 | HR 81 | Temp 98.4°F | Resp 16 | Wt 112.0 lb

## 2020-09-30 DIAGNOSIS — M5416 Radiculopathy, lumbar region: Secondary | ICD-10-CM | POA: Diagnosis not present

## 2020-09-30 MED ORDER — MELOXICAM 7.5 MG PO TABS
7.5000 mg | ORAL_TABLET | Freq: Every day | ORAL | 0 refills | Status: DC
  Filled 2020-09-30: qty 14, 14d supply, fill #0

## 2020-09-30 NOTE — Assessment & Plan Note (Addendum)
New. Will rx with meloxicam 7.5mg  once daily. She is advised to call if symptoms worsen or if symptoms fail to improve.

## 2020-09-30 NOTE — Patient Instructions (Signed)
Please begin meloxicam once daily for leg/hip pain. Call if symptoms worsen of if symptoms do not improve.

## 2020-09-30 NOTE — Progress Notes (Signed)
Subjective:   By signing my name below, I, Nicole Schneider, attest that this documentation has been prepared under the direction and in the presence of Nicole Alar NP. 09/30/2020      Patient ID: Nicole Schneider, female    DOB: 12/31/1965, 55 y.o.   MRN: [id number]  Chief Complaint  Patient presents with  . Leg Pain    Complains of right leg pain    HPI Patient is in today for a office visit. She has a Biomedical engineer to help communication. Her interpreters ID number is [id number]. She complains of right anterior thigh pain as well as pain in the right buttock for the past 2-3 weeks. She took aleve Saturday and Sunday to manage her pain and found great relief. At the same time she got the injury her hips started to hurt. Pain is described as intermittent. She denies swelling around her knee.    Past Medical History:  Diagnosis Date  . Allergy   . Iron deficiency anemia   . Osteopenia   . Seasonal allergies     Past Surgical History:  Procedure Laterality Date  . miscarriage      Family History  Problem Relation Age of Onset  . Colon cancer Neg Hx     Social History   Socioeconomic History  . Marital status: Married    Spouse name: Not on file  . Number of children: Not on file  . Years of education: Not on file  . Highest education level: Not on file  Occupational History  . Not on file  Tobacco Use  . Smoking status: Never Smoker  . Smokeless tobacco: Never Used  Substance and Sexual Activity  . Alcohol use: No    Alcohol/week: 0.0 standard drinks  . Drug use: No  . Sexual activity: Not on file  Other Topics Concern  . Not on file  Social History Narrative   Works in [place]   Married   2 sons.   Grew up in [place].       Social Determinants of Health   Financial Resource Strain: Not on file  Food Insecurity: Not on file  Transportation Needs: Not on file  Physical Activity: Not on file  Stress: Not on file  Social Connections: Not on file   Intimate Partner Violence: Not on file    Outpatient Medications Prior to Visit  Medication Sig Dispense Refill  . Cholecalciferol (VITAMIN D3) 3000 units TABS Take 1 tablet by mouth daily. 30 tablet   . ferrous sulfate 325 (65 FE) MG tablet Take 325 mg by mouth 2 (two) times daily.    . Multiple Vitamins-Minerals (ONE DAILY MULTIVITAMIN WOMEN PO) Take 1 tablet by mouth daily.    . Omega-3 Fatty Acids (FISH OIL) 1000 MG CPDR Take 1 capsule by mouth daily.    Marland Kitchen omeprazole (PRILOSEC) 40 MG capsule Take 1 capsule (40 mg total) by mouth daily. 90 capsule 1   No facility-administered medications prior to visit.    No Known Allergies  Review of Systems  Musculoskeletal: Positive for myalgias (Right leg).       (-)Swelling around knee       Objective:    Physical Exam Constitutional:      General: She is not in acute distress.    Appearance: Normal appearance. She is not ill-appearing.  HENT:     Head: Normocephalic and atraumatic.     Right Ear: External ear normal.     Left Ear: External  ear normal.  Eyes:     Extraocular Movements: Extraocular movements intact.     Pupils: Pupils are equal, round, and reactive to light.  Cardiovascular:     Rate and Rhythm: Normal rate and regular rhythm.     Pulses: Normal pulses.     Heart sounds: Normal heart sounds. No murmur heard. No gallop.   Pulmonary:     Effort: Pulmonary effort is normal. No respiratory distress.     Breath sounds: Normal breath sounds. No wheezing, rhonchi or rales.  Musculoskeletal:     Thoracic back: No tenderness.     Lumbar back: No tenderness.     Comments:    Skin:    General: Skin is warm and dry.  Neurological:     Mental Status: She is alert and oriented to person, place, and time.  Psychiatric:        Behavior: Behavior normal.     BP 126/67 (BP Location: Left Arm, Patient Position: Sitting, Cuff Size: Small)   Pulse 81   Temp 98.4 F (36.9 C) (Oral)   Resp 16   Wt 112 lb (50.8 kg)    SpO2 100%   BMI 21.87 kg/m  Wt Readings from Last 3 Encounters:  09/30/20 112 lb (50.8 kg)  07/22/20 112 lb (50.8 kg)  06/24/20 112 lb 12.8 oz (51.2 kg)    Diabetic Foot Exam - Simple   No data filed    Lab Results  Component Value Date   WBC 4.4 06/24/2020   HGB 11.4 (L) 06/24/2020   HCT 36.2 06/24/2020   PLT 221.0 06/24/2020   GLUCOSE 91 06/24/2020   CHOL 223 (H) 06/24/2020   TRIG 92.0 06/24/2020   HDL 64.10 06/24/2020   LDLCALC 141 (H) 06/24/2020   ALT 17 06/24/2020   AST 19 06/24/2020   NA 141 06/24/2020   K 4.7 06/24/2020   CL 104 06/24/2020   CREATININE 0.80 06/24/2020   BUN 19 06/24/2020   CO2 31 06/24/2020   TSH 0.89 06/17/2019    Lab Results  Component Value Date   TSH 0.89 06/17/2019   Lab Results  Component Value Date   WBC 4.4 06/24/2020   HGB 11.4 (L) 06/24/2020   HCT 36.2 06/24/2020   MCV 78.9 06/24/2020   PLT 221.0 06/24/2020   Lab Results  Component Value Date   NA 141 06/24/2020   K 4.7 06/24/2020   CO2 31 06/24/2020   GLUCOSE 91 06/24/2020   BUN 19 06/24/2020   CREATININE 0.80 06/24/2020   BILITOT 0.8 06/24/2020   ALKPHOS 79 06/24/2020   AST 19 06/24/2020   ALT 17 06/24/2020   PROT 7.4 06/24/2020   ALBUMIN 4.6 06/24/2020   CALCIUM 10.0 06/24/2020   GFR 82.53 06/24/2020   Lab Results  Component Value Date   CHOL 223 (H) 06/24/2020   Lab Results  Component Value Date   HDL 64.10 06/24/2020   Lab Results  Component Value Date   LDLCALC 141 (H) 06/24/2020   Lab Results  Component Value Date   TRIG 92.0 06/24/2020   Lab Results  Component Value Date   CHOLHDL 3 06/24/2020   No results found for: HGBA1C     Assessment & Plan:   Problem List Items Addressed This Visit      Unprioritized   Lumbar radiculopathy - Primary    Burmese interpretor           Meds ordered this encounter  Medications  . meloxicam (MOBIC) 7.5  MG tablet    Sig: Take 1 tablet (7.5 mg total) by mouth daily.    Dispense:  14 tablet     Refill:  0    Order Specific Question:   Supervising Provider    Answer:   Penni Homans A [[id number]]    I, Nicole Alar NP, personally preformed the services described in this documentation.  All medical record entries made by the scribe were at my direction and in my presence.  I have reviewed the chart and discharge instructions (if applicable) and agree that the record reflects my personal performance and is accurate and complete. 09/30/2020   I,Nicole Schneider,acting as a Education administrator for Nance Pear, NP.,have documented all relevant documentation on the behalf of Nance Pear, NP,as directed by  Nance Pear, NP while in the presence of Nance Pear, NP.   Nance Pear, NP

## 2021-01-25 ENCOUNTER — Ambulatory Visit: Payer: No Typology Code available for payment source | Attending: Internal Medicine

## 2021-01-25 ENCOUNTER — Other Ambulatory Visit: Payer: Self-pay

## 2021-01-25 ENCOUNTER — Ambulatory Visit: Payer: No Typology Code available for payment source | Admitting: Family

## 2021-01-25 VITALS — BP 120/68 | HR 74 | Temp 98.3°F | Resp 18 | Ht 61.0 in | Wt 112.0 lb

## 2021-01-25 DIAGNOSIS — Z23 Encounter for immunization: Secondary | ICD-10-CM

## 2021-01-25 DIAGNOSIS — D509 Iron deficiency anemia, unspecified: Secondary | ICD-10-CM | POA: Diagnosis not present

## 2021-01-25 DIAGNOSIS — M5416 Radiculopathy, lumbar region: Secondary | ICD-10-CM | POA: Diagnosis not present

## 2021-01-25 DIAGNOSIS — K219 Gastro-esophageal reflux disease without esophagitis: Secondary | ICD-10-CM | POA: Insufficient documentation

## 2021-01-25 DIAGNOSIS — E559 Vitamin D deficiency, unspecified: Secondary | ICD-10-CM | POA: Diagnosis not present

## 2021-01-25 LAB — CBC WITH DIFFERENTIAL/PLATELET
Basophils Absolute: 0 10*3/uL (ref 0.0–0.1)
Basophils Relative: 0.6 % (ref 0.0–3.0)
Eosinophils Absolute: 0.1 10*3/uL (ref 0.0–0.7)
Eosinophils Relative: 2.2 % (ref 0.0–5.0)
HCT: 34.6 % — ABNORMAL LOW (ref 36.0–46.0)
Hemoglobin: 11 g/dL — ABNORMAL LOW (ref 12.0–15.0)
Lymphocytes Relative: 38 % (ref 12.0–46.0)
Lymphs Abs: 1.9 10*3/uL (ref 0.7–4.0)
MCHC: 31.8 g/dL (ref 30.0–36.0)
MCV: 78.6 fl (ref 78.0–100.0)
Monocytes Absolute: 0.4 10*3/uL (ref 0.1–1.0)
Monocytes Relative: 8.5 % (ref 3.0–12.0)
Neutro Abs: 2.5 10*3/uL (ref 1.4–7.7)
Neutrophils Relative %: 50.7 % (ref 43.0–77.0)
Platelets: 222 10*3/uL (ref 150.0–400.0)
RBC: 4.4 Mil/uL (ref 3.87–5.11)
RDW: 14.1 % (ref 11.5–15.5)
WBC: 4.9 10*3/uL (ref 4.0–10.5)

## 2021-01-25 LAB — VITAMIN D 25 HYDROXY (VIT D DEFICIENCY, FRACTURES): VITD: 88.44 ng/mL (ref 30.00–100.00)

## 2021-01-25 LAB — FERRITIN: Ferritin: 74.3 ng/mL (ref 10.0–291.0)

## 2021-01-25 LAB — IRON: Iron: 103 ug/dL (ref 42–145)

## 2021-01-25 NOTE — Progress Notes (Signed)
Subjective:   By signing my name below, I, [name], attest that this documentation has been prepared under the direction and in the presence of [name] NP. 01/25/2021    Patient ID: Nicole Schneider, female    DOB: Jun 16, 1965, 55 y.o.   MRN: [id number]  Chief Complaint  Patient presents with   Follow-up    6 month    HPI Patient is in today for a office visit.   Back pain- She reports that her back pain that radiated down her leg has improved since her last visit. Iron- She continues taking iron supplements and reports no new issues while taking it.  Immunizations- She is UTD on flu vaccine. She has 3 Covid-19 vaccines at this time. Reflux- She no longer takes omeprazole and reports no symptoms while stopping it. Vitamin D- She continues taking vitamin D supplements at this time and is willing to get her vitamin D levels checked during her next lab work.    Health Maintenance Due  Topic Date Due   COVID-19 Vaccine (4 - Booster for [employer] series) 08/14/2020   INFLUENZA VACCINE  12/05/2020    Past Medical History:  Diagnosis Date   Allergy    Iron deficiency anemia    Osteopenia    Seasonal allergies     Past Surgical History:  Procedure Laterality Date   miscarriage      Family History  Problem Relation Age of Onset   Colon cancer Neg Hx     Social History   Socioeconomic History   Marital status: Married    Spouse name: Not on file   Number of children: Not on file   Years of education: Not on file   Highest education level: Not on file  Occupational History   Not on file  Tobacco Use   Smoking status: Never   Smokeless tobacco: Never  Substance and Sexual Activity   Alcohol use: No    Alcohol/week: 0.0 standard drinks   Drug use: No   Sexual activity: Not on file  Other Topics Concern   Not on file  Social History Narrative   Works in [place]   Married   2 sons.   Grew up in [place].       Social Determinants of Health    Financial Resource Strain: Not on file  Food Insecurity: Not on file  Transportation Needs: Not on file  Physical Activity: Not on file  Stress: Not on file  Social Connections: Not on file  Intimate Partner Violence: Not on file    Outpatient Medications Prior to Visit  Medication Sig Dispense Refill   Cholecalciferol (VITAMIN D3) 3000 units TABS Take 1 tablet by mouth daily. 30 tablet    ferrous sulfate 325 (65 FE) MG tablet Take 325 mg by mouth 2 (two) times daily.     Multiple Vitamins-Minerals (ONE DAILY MULTIVITAMIN WOMEN PO) Take 1 tablet by mouth daily.     Omega-3 Fatty Acids (FISH OIL) 1000 MG CPDR Take 1 capsule by mouth daily.     meloxicam (MOBIC) 7.5 MG tablet Take 1 tablet (7.5 mg total) by mouth daily. 14 tablet 0   omeprazole (PRILOSEC) 40 MG capsule Take 1 capsule (40 mg total) by mouth daily. 90 capsule 1   No facility-administered medications prior to visit.    No Known Allergies  ROS See HPI    Objective:    Physical Exam Constitutional:      General: She is not in acute distress.  Appearance: Normal appearance. She is not ill-appearing.  HENT:     Head: Normocephalic and atraumatic.     Right Ear: External ear normal.     Left Ear: External ear normal.  Eyes:     Extraocular Movements: Extraocular movements intact.     Pupils: Pupils are equal, round, and reactive to light.  Cardiovascular:     Rate and Rhythm: Normal rate and regular rhythm.     Heart sounds: Normal heart sounds. No murmur heard.   No gallop.  Pulmonary:     Effort: Pulmonary effort is normal. No respiratory distress.     Breath sounds: Normal breath sounds. No wheezing or rales.  Skin:    General: Skin is warm and dry.  Neurological:     Mental Status: She is alert and oriented to person, place, and time.  Psychiatric:        Behavior: Behavior normal.    BP 120/68   Pulse 74   Temp 98.3 F (36.8 C)   Resp 18   Ht 5\' 1"  (1.549 m)   Wt 112 lb (50.8 kg)   SpO2  100%   BMI 21.16 kg/m  Wt Readings from Last 3 Encounters:  01/25/21 112 lb (50.8 kg)  09/30/20 112 lb (50.8 kg)  07/22/20 112 lb (50.8 kg)       Assessment & Plan:   Problem List Items Addressed This Visit       Unprioritized   Lumbar radiculopathy    Clinically resolved. Monitor.       Iron deficiency anemia - Primary    Clinically stable. Continue iron supplement. Check cbc, iron studies.       Relevant Orders   CBC with Differential/Platelet   Iron   Ferritin   Gastroesophageal reflux disease    Stable off of omeprazole. Monitor.       Other Visit Diagnoses     Need for influenza vaccination       Relevant Orders   Flu Vaccine QUAD 75mo+IM (Fluarix, Fluzone & Alfiuria Quad PF)   Vitamin D deficiency       Relevant Orders   Vitamin D (25 hydroxy)        No orders of the defined types were placed in this encounter.   I, [name] NP, personally preformed the services described in this documentation.  All medical record entries made by the scribe were at my direction and in my presence.  I have reviewed the chart and discharge instructions (if applicable) and agree that the record reflects my personal performance and is accurate and complete. 01/25/2021   I,[name],acting as a Education administrator for Nance Pear, NP.,have documented all relevant documentation on the behalf of Nance Pear, NP,as directed by  Nance Pear, NP while in the presence of Nance Pear, NP.   Nance Pear, NP

## 2021-01-25 NOTE — Progress Notes (Signed)
   Cov[facility] Vaccination [facility]  Name:  Avianah Pellman    MRN: [id number] DOB: 1966-03-10  01/25/2021  Ms. Jericha was observed post Covid-19 immunization for 15 minutes without incident. She was provided with Vaccine Information Sheet and instruction to access the V-Safe system.   Ms. Brandey was instructed to call 911 with any severe reactions post vaccine: Difficulty breathing  Swelling of face and throat  A fast heartbeat  A bad rash all over body  Dizziness and weakness

## 2021-01-25 NOTE — Assessment & Plan Note (Signed)
Clinically resolved. Monitor.

## 2021-01-25 NOTE — Patient Instructions (Signed)
Please complete lab work prior to leaving.   

## 2021-01-25 NOTE — Assessment & Plan Note (Signed)
Stable off of omeprazole. Monitor.

## 2021-01-25 NOTE — Assessment & Plan Note (Signed)
Clinically stable. Continue iron supplement. Check cbc, iron studies.

## 2021-01-26 ENCOUNTER — Encounter: Payer: Self-pay | Admitting: Family

## 2021-01-27 ENCOUNTER — Ambulatory Visit: Payer: No Typology Code available for payment source | Admitting: Family

## 2021-01-27 NOTE — Progress Notes (Signed)
Mailed out to pt 

## 2021-01-31 ENCOUNTER — Other Ambulatory Visit (HOSPITAL_BASED_OUTPATIENT_CLINIC_OR_DEPARTMENT_OTHER): Payer: Self-pay

## 2021-01-31 MED ORDER — COVID-19MRNA BIVAL VACC PFIZER 30 MCG/0.3ML IM SUSP
INTRAMUSCULAR | 0 refills | Status: DC
  Filled 2021-01-31: qty 0.3, 1d supply, fill #0

## 2021-06-28 ENCOUNTER — Encounter: Payer: Self-pay | Admitting: Family

## 2021-06-28 ENCOUNTER — Ambulatory Visit (INDEPENDENT_AMBULATORY_CARE_PROVIDER_SITE_OTHER): Payer: No Typology Code available for payment source | Admitting: Family

## 2021-06-28 VITALS — BP 128/75 | HR 65 | Temp 98.4°F | Resp 16 | Ht 60.0 in | Wt 113.2 lb

## 2021-06-28 DIAGNOSIS — E785 Hyperlipidemia, unspecified: Secondary | ICD-10-CM | POA: Diagnosis not present

## 2021-06-28 DIAGNOSIS — D509 Iron deficiency anemia, unspecified: Secondary | ICD-10-CM | POA: Diagnosis not present

## 2021-06-28 DIAGNOSIS — Z0000 Encounter for general adult medical examination without abnormal findings: Secondary | ICD-10-CM

## 2021-06-28 LAB — COMPREHENSIVE METABOLIC PANEL
ALT: 21 U/L (ref 0–35)
AST: 21 U/L (ref 0–37)
Albumin: 4.6 g/dL (ref 3.5–5.2)
Alkaline Phosphatase: 71 U/L (ref 39–117)
BUN: 17 mg/dL (ref 6–23)
CO2: 33 mEq/L — ABNORMAL HIGH (ref 19–32)
Calcium: 9.9 mg/dL (ref 8.4–10.5)
Chloride: 105 mEq/L (ref 96–112)
Creatinine, Ser: 0.74 mg/dL (ref 0.40–1.20)
GFR: 89.99 mL/min (ref >60.00)
Glucose, Bld: 92 mg/dL (ref 70–99)
Potassium: 4.9 mEq/L (ref 3.5–5.1)
Sodium: 141 mEq/L (ref 135–145)
Total Bilirubin: 0.8 mg/dL (ref 0.2–1.2)
Total Protein: 7.2 g/dL (ref 6.0–8.3)

## 2021-06-28 LAB — LIPID PANEL
Cholesterol: 204 mg/dL — ABNORMAL HIGH (ref 0–200)
HDL: 56.4 mg/dL (ref >39.00)
LDL Cholesterol: 130 mg/dL — ABNORMAL HIGH (ref 0–99)
NonHDL: 147.75
Total CHOL/HDL Ratio: 4
Triglycerides: 91 mg/dL (ref 0.0–149.0)
VLDL: 18.2 mg/dL (ref 0.0–40.0)

## 2021-06-28 LAB — CBC WITH DIFFERENTIAL/PLATELET
Basophils Absolute: 0 10*3/uL (ref 0.0–0.1)
Basophils Relative: 0.3 % (ref 0.0–3.0)
Eosinophils Absolute: 0.1 10*3/uL (ref 0.0–0.7)
Eosinophils Relative: 1.4 % (ref 0.0–5.0)
HCT: 36.2 % (ref 36.0–46.0)
Hemoglobin: 11.5 g/dL — ABNORMAL LOW (ref 12.0–15.0)
Lymphocytes Relative: 37.8 % (ref 12.0–46.0)
Lymphs Abs: 2.1 10*3/uL (ref 0.7–4.0)
MCHC: 31.8 g/dL (ref 30.0–36.0)
MCV: 79.7 fl (ref 78.0–100.0)
Monocytes Absolute: 0.4 10*3/uL (ref 0.1–1.0)
Monocytes Relative: 6.6 % (ref 3.0–12.0)
Neutro Abs: 2.9 10*3/uL (ref 1.4–7.7)
Neutrophils Relative %: 53.9 % (ref 43.0–77.0)
Platelets: 221 10*3/uL (ref 150.0–400.0)
RBC: 4.55 Mil/uL (ref 3.87–5.11)
RDW: 14 % (ref 11.5–15.5)
WBC: 5.5 10*3/uL (ref 4.0–10.5)

## 2021-06-28 NOTE — Patient Instructions (Signed)
Please schedule  a follow up visit with your dentist.

## 2021-06-28 NOTE — Assessment & Plan Note (Signed)
Continue healthy diet, exercise.  Immunizations reviewed and up to date. Pap/colo up to date. Mammogram will be due on March. Encouraged pt to schedule a follow up dental visit.

## 2021-06-28 NOTE — Progress Notes (Signed)
Subjective:     Patient ID: Nicole Schneider, female    DOB: 09/26/1965, 56 y.o.   MRN: [id number]  Chief Complaint  Patient presents with   Annual Exam    HPI Patient is in today for cpx.  Immunizations: up to date Diet: healthy Wt Readings from Last 3 Encounters:  06/28/21 113 lb 3.2 oz (51.3 kg)  01/25/21 112 lb (50.8 kg)  09/30/20 112 lb (50.8 kg)    Exercise: walks 3 days a week Colonoscopy: 2017 Pap Smear: 2021, normal Mammogram: due in March Vision: up to date Dental: due   There are no preventive care reminders to display for this patient.  Past Medical History:  Diagnosis Date   Allergy    Iron deficiency anemia    Osteopenia    Seasonal allergies     Past Surgical History:  Procedure Laterality Date   miscarriage      Family History  Problem Relation Age of Onset   Colon cancer Neg Hx     Social History   Socioeconomic History   Marital status: Married    Spouse name: Not on file   Number of children: Not on file   Years of education: Not on file   Highest education level: Not on file  Occupational History   Not on file  Tobacco Use   Smoking status: Never   Smokeless tobacco: Never  Substance and Sexual Activity   Alcohol use: No    Alcohol/week: 0.0 standard drinks   Drug use: No   Sexual activity: Not Currently  Other Topics Concern   Not on file  Social History Narrative   Works in [employer]   Married   2 sons.   Grew up in [place].       Social Determinants of Health   Financial Resource Strain: Not on file  Food Insecurity: Not on file  Transportation Needs: Not on file  Physical Activity: Not on file  Stress: Not on file  Social Connections: Not on file  Intimate Partner Violence: Not on file    Outpatient Medications Prior to Visit  Medication Sig Dispense Refill   Cholecalciferol (VITAMIN D3) 3000 units TABS Take 1 tablet by mouth daily. 30 tablet    COVID-19 mRNA bivalent vaccine, Pfizer, injection Inject into the  muscle. 0.3 mL 0   ferrous sulfate 325 (65 FE) MG tablet Take 325 mg by mouth 2 (two) times daily.     Multiple Vitamins-Minerals (ONE DAILY MULTIVITAMIN WOMEN PO) Take 1 tablet by mouth daily.     Omega-3 Fatty Acids (FISH OIL) 1000 MG CPDR Take 1 capsule by mouth daily.     No facility-administered medications prior to visit.    No Known Allergies  Review of Systems  Constitutional:  Negative for weight loss.  HENT:  Negative for congestion and hearing loss.   Eyes:  Negative for blurred vision.  Respiratory:  Negative for cough.   Cardiovascular:  Negative for leg swelling.  Gastrointestinal:  Negative for constipation, diarrhea and nausea.  Genitourinary:  Negative for dysuria and frequency.  Musculoskeletal:  Positive for joint pain (some arthritis pain in hands/elbows). Negative for myalgias.  Skin:  Negative for rash.  Neurological:  Positive for headaches (occasional mild headaches).  Psychiatric/Behavioral:         Denies depression/anxiety      Objective:    Physical Exam  BP 128/75 (BP Location: Right Arm, Patient Position: Sitting, Cuff Size: Small)    Pulse 65  Temp 98.4 F (36.9 C) (Oral)    Resp 16    Ht 5' (1.524 m)    Wt 113 lb 3.2 oz (51.3 kg)    SpO2 100%    BMI 22.11 kg/m  Wt Readings from Last 3 Encounters:  06/28/21 113 lb 3.2 oz (51.3 kg)  01/25/21 112 lb (50.8 kg)  09/30/20 112 lb (50.8 kg)   Physical Exam  Constitutional: She is oriented to person, place, and time. She appears well-developed and well-nourished. No distress.  HENT:  Head: Normocephalic and atraumatic.  Right Ear: Tympanic membrane and ear canal normal.  Left Ear: Tympanic membrane and ear canal normal.  Mouth/Throat: not examined Eyes: Pupils are equal, round, and reactive to light. No scleral icterus.  Neck: Normal range of motion. No thyromegaly present.  Cardiovascular: Normal rate and regular rhythm.   No murmur heard. Pulmonary/Chest: Effort normal and breath sounds  normal. No respiratory distress. He has no wheezes. She has no rales. She exhibits no tenderness.  Abdominal: Soft. Bowel sounds are normal. She exhibits no distension and no mass. There is no tenderness. There is no rebound and no guarding.  Musculoskeletal: She exhibits no edema.  Lymphadenopathy:    She has no cervical adenopathy.  Neurological: She is alert and oriented to person, place, and time. She has normal patellar reflexes. She exhibits normal muscle tone. Coordination normal.  Skin: Skin is warm and dry.  Psychiatric: She has a normal mood and affect. Her behavior is normal. Judgment and thought content normal.  Breast/Pelvic:  deferred         Assessment & Plan:       Assessment & Plan:   Problem List Items Addressed This Visit       Unprioritized   Routine general medical examination at a health care facility    Continue healthy diet, exercise.  Immunizations reviewed and up to date. Pap/colo up to date. Mammogram will be due on March. Encouraged pt to schedule a follow up dental visit.       Iron deficiency anemia   Relevant Orders   CBC with Differential/Platelet   Other Visit Diagnoses     Preventative health care    -  Primary   Relevant Orders   MM 3D SCREEN BREAST BILATERAL   Hyperlipidemia, unspecified hyperlipidemia type       Relevant Orders   Comp Met (CMET)   Lipid panel       I am having Nicole Schneider maintain her ferrous sulfate, Fish Oil, Multiple Vitamins-Minerals (ONE DAILY MULTIVITAMIN WOMEN PO), Vitamin D3, and COVID-19 mRNA bivalent vaccine [employer]).  No orders of the defined types were placed in this encounter.

## 2021-06-29 ENCOUNTER — Encounter: Payer: Self-pay | Admitting: Family

## 2021-06-29 NOTE — Progress Notes (Signed)
Mailed out to patient 

## 2021-07-03 ENCOUNTER — Inpatient Hospital Stay (HOSPITAL_BASED_OUTPATIENT_CLINIC_OR_DEPARTMENT_OTHER): Admission: RE | Admit: 2021-07-03 | Payer: No Typology Code available for payment source | Source: Ambulatory Visit

## 2021-07-10 ENCOUNTER — Ambulatory Visit (HOSPITAL_BASED_OUTPATIENT_CLINIC_OR_DEPARTMENT_OTHER): Payer: No Typology Code available for payment source

## 2021-07-12 ENCOUNTER — Ambulatory Visit (HOSPITAL_BASED_OUTPATIENT_CLINIC_OR_DEPARTMENT_OTHER)
Admission: RE | Admit: 2021-07-12 | Discharge: 2021-07-12 | Disposition: A | Discharge status: Home / Self Care | Payer: No Typology Code available for payment source | Source: Ambulatory Visit | Attending: Family | Admitting: Family

## 2021-07-12 ENCOUNTER — Encounter (HOSPITAL_BASED_OUTPATIENT_CLINIC_OR_DEPARTMENT_OTHER): Payer: Self-pay

## 2021-07-12 ENCOUNTER — Other Ambulatory Visit: Payer: Self-pay

## 2021-07-12 DIAGNOSIS — Z0000 Encounter for general adult medical examination without abnormal findings: Secondary | ICD-10-CM

## 2021-07-12 DIAGNOSIS — Z1231 Encounter for screening mammogram for malignant neoplasm of breast: Secondary | ICD-10-CM | POA: Diagnosis present

## 2022-07-02 ENCOUNTER — Encounter: Payer: Self-pay | Admitting: Family

## 2022-07-02 ENCOUNTER — Ambulatory Visit (INDEPENDENT_AMBULATORY_CARE_PROVIDER_SITE_OTHER): Payer: No Typology Code available for payment source | Admitting: Family

## 2022-07-02 VITALS — BP 133/68 | HR 68 | Temp 98.1°F | Resp 16 | Wt 115.0 lb

## 2022-07-02 DIAGNOSIS — Z1231 Encounter for screening mammogram for malignant neoplasm of breast: Secondary | ICD-10-CM

## 2022-07-02 DIAGNOSIS — E559 Vitamin D deficiency, unspecified: Secondary | ICD-10-CM

## 2022-07-02 DIAGNOSIS — D509 Iron deficiency anemia, unspecified: Secondary | ICD-10-CM | POA: Diagnosis not present

## 2022-07-02 DIAGNOSIS — Z0001 Encounter for general adult medical examination with abnormal findings: Secondary | ICD-10-CM

## 2022-07-02 DIAGNOSIS — J309 Allergic rhinitis, unspecified: Secondary | ICD-10-CM | POA: Diagnosis not present

## 2022-07-02 DIAGNOSIS — E785 Hyperlipidemia, unspecified: Secondary | ICD-10-CM

## 2022-07-02 DIAGNOSIS — R519 Headache, unspecified: Secondary | ICD-10-CM

## 2022-07-02 LAB — CBC WITH DIFFERENTIAL/PLATELET
Basophils Absolute: 0 10*3/uL (ref 0.0–0.1)
Basophils Relative: 0.5 % (ref 0.0–3.0)
Eosinophils Absolute: 0.1 10*3/uL (ref 0.0–0.7)
Eosinophils Relative: 1.8 % (ref 0.0–5.0)
HCT: 36.8 % (ref 36.0–46.0)
Hemoglobin: 11.7 g/dL — ABNORMAL LOW (ref 12.0–15.0)
Lymphocytes Relative: 44.1 % (ref 12.0–46.0)
Lymphs Abs: 2.2 10*3/uL (ref 0.7–4.0)
MCHC: 31.9 g/dL (ref 30.0–36.0)
MCV: 79.5 fl (ref 78.0–100.0)
Monocytes Absolute: 0.3 10*3/uL (ref 0.1–1.0)
Monocytes Relative: 6.5 % (ref 3.0–12.0)
Neutro Abs: 2.4 10*3/uL (ref 1.4–7.7)
Neutrophils Relative %: 47.1 % (ref 43.0–77.0)
Platelets: 235 10*3/uL (ref 150.0–400.0)
RBC: 4.63 Mil/uL (ref 3.87–5.11)
RDW: 14.3 % (ref 11.5–15.5)
WBC: 5.1 10*3/uL (ref 4.0–10.5)

## 2022-07-02 LAB — LIPID PANEL
Cholesterol: 231 mg/dL — ABNORMAL HIGH (ref 0–200)
HDL: 57.9 mg/dL (ref >39.00)
LDL Cholesterol: 154 mg/dL — ABNORMAL HIGH (ref 0–99)
NonHDL: 173.58
Total CHOL/HDL Ratio: 4
Triglycerides: 99 mg/dL (ref 0.0–149.0)
VLDL: 19.8 mg/dL (ref 0.0–40.0)

## 2022-07-02 LAB — COMPREHENSIVE METABOLIC PANEL
ALT: 20 U/L (ref 0–35)
AST: 21 U/L (ref 0–37)
Albumin: 4.5 g/dL (ref 3.5–5.2)
Alkaline Phosphatase: 68 U/L (ref 39–117)
BUN: 18 mg/dL (ref 6–23)
CO2: 28 mEq/L (ref 19–32)
Calcium: 10.1 mg/dL (ref 8.4–10.5)
Chloride: 106 mEq/L (ref 96–112)
Creatinine, Ser: 0.74 mg/dL (ref 0.40–1.20)
GFR: 89.35 mL/min (ref >60.00)
Glucose, Bld: 93 mg/dL (ref 70–99)
Potassium: 5.1 mEq/L (ref 3.5–5.1)
Sodium: 142 mEq/L (ref 135–145)
Total Bilirubin: 0.8 mg/dL (ref 0.2–1.2)
Total Protein: 7.4 g/dL (ref 6.0–8.3)

## 2022-07-02 LAB — VITAMIN D 25 HYDROXY (VIT D DEFICIENCY, FRACTURES): VITD: 51.09 ng/mL (ref 30.00–100.00)

## 2022-07-02 LAB — TSH: TSH: 1.47 u[IU]/mL (ref 0.35–5.50)

## 2022-07-02 MED ORDER — LORATADINE 10 MG PO TABS: 10.0000 mg | ORAL_TABLET | Freq: Every day | ORAL | 11 refills | Status: AC

## 2022-07-02 NOTE — Progress Notes (Addendum)
Subjective:   By signing my name below, I, Nicole Schneider, attest that this documentation has been prepared under the direction and in the presence of Nicole Alar, NP. 07/02/2022   Patient ID: Nicole Schneider, female    DOB: 06-08-1965, 57 y.o.   MRN: [id number]  Chief Complaint  Patient presents with   Anemia    Here for follow up    HPI Patient is in today for a comprehensive physical exam. She is using an interpreter for this visit. Interpreter [id number].  Headaches: She complains of headaches that occur at top of her head for the past 2 weeks. She has not taken any medications to manage it.   Allergies: She is experiencing throat discomfort that causes her to cough. She has allergies to pollen and flowers that make her eyes water and throat sore.   Multivitamin: She is taking a multivitamin with minerals daily PO.    Acute: She denies fever, unexpected weight change, adenopathy, new moles, sinus pain, sore throat, visual disturbance, chest pain, palpitations, leg swelling, cough, shortness of breath, wheezing, nausea, vomiting, diarrhea, blood in stool, dysuria, frequency, hematuria, new muscle pain, headaches, depression or anxiety at this time.   Anemia: She has a history of anemia.  Lab Results  Component Value Date   IRON 103 01/25/2021   TIBC 329 09/21/2013   FERRITIN 74.3 01/25/2021    Social history: She does not consume alcohol. She does not smoke. She does not use any drugs or vaping products. She is not sexually active.   Immunizations: She is UTD on the first three Covid-19 vaccinations, but has not yet received the latest booster.  Diet: She eats a balanced diet and consumes vegetables everyday.  Exercise: She takes walks three times a week.  Colonoscopy: Last colonoscopy on 06/30/2015. A 76m sessile polyp was found in the ascending colon but otherwise results are normal. Repeat in 10 years.   Dexa: Last Dexa on 07/11/20. Patient's diagnostic category is low  bone mass/osteopenia by WHO Criteria.   Mammogram: Last mammogram on 07/13/2022. Results are normal. Repeat in 1 year.    Dental: She is not UTD on dental yet this year.  Vision: She is not UTD on vision yet this year.   Past Medical History:  Diagnosis Date   Allergy    Iron deficiency anemia    Osteopenia    Seasonal allergies     Past Surgical History:  Procedure Laterality Date   miscarriage      Family History  Problem Relation Age of Onset   Colon cancer Neg Hx     Social History   Socioeconomic History   Marital status: Married    Spouse name: Not on file   Number of children: Not on file   Years of education: Not on file   Highest education level: Not on file  Occupational History   Not on file  Tobacco Use   Smoking status: Never   Smokeless tobacco: Never  Substance and Sexual Activity   Alcohol use: No    Alcohol/week: 0.0 standard drinks of alcohol   Drug use: No   Sexual activity: Not Currently  Other Topics Concern   Not on file  Social History Narrative   [place]  Married   2 sons.   [place]       Social Determinants of Health   Financial Resource Strain: Not on file  Food Insecurity: Not on file  Transportation Needs:  Not on file  Physical Activity: Not on file  Stress: Not on file  Social Connections: Not on file  Intimate Partner Violence: Not on file    Outpatient Medications Prior to Visit  Medication Sig Dispense Refill   Cholecalciferol (VITAMIN D3) 3000 units TABS Take 1 tablet by mouth daily. 30 tablet    Multiple Vitamins-Minerals (ONE DAILY MULTIVITAMIN WOMEN PO) Take 1 tablet by mouth daily.     COVID-19 mRNA bivalent vaccine, Pfizer, injection Inject into the muscle. 0.3 mL 0   ferrous sulfate 325 (65 FE) MG tablet Take 325 mg by mouth 2 (two) times daily.     Omega-3 Fatty Acids (FISH OIL) 1000 MG CPDR Take 1 capsule by mouth daily.     No facility-administered medications prior to visit.     No Known Allergies  Review of Systems  Constitutional:  Negative for fever.       (-) unexpected weight changes  HENT:  Negative for sinus pain and sore throat.   Eyes:        (-) visual disturbance  Respiratory:  Negative for cough, shortness of breath and wheezing.   Cardiovascular:  Negative for chest pain, palpitations and leg swelling.  Gastrointestinal:  Positive for constipation. Negative for blood in stool, diarrhea, nausea and vomiting.  Genitourinary:  Negative for dysuria, frequency and hematuria.  Musculoskeletal:        (-) new muscle pain (+) right knee pain a month ago  Skin:        (-)new moles   Neurological:  Negative for headaches.  Psychiatric/Behavioral:  Negative for depression.        (-)anxiety       Objective:    Physical Exam Constitutional:      General: She is not in acute distress.    Appearance: Normal appearance.  HENT:     Head: Normocephalic and atraumatic.     Right Ear: External ear normal.     Left Ear: External ear normal.  Eyes:     Extraocular Movements: Extraocular movements intact.     Right eye: No nystagmus.     Left eye: No nystagmus.     Pupils: Pupils are equal, round, and reactive to light.  Cardiovascular:     Rate and Rhythm: Normal rate and regular rhythm.     Heart sounds: Normal heart sounds. No murmur heard.    No gallop.  Pulmonary:     Effort: Pulmonary effort is normal. No respiratory distress.     Breath sounds: Normal breath sounds. No wheezing or rales.  Abdominal:     General: Bowel sounds are normal. There is no distension.     Palpations: Abdomen is soft.     Tenderness: There is no abdominal tenderness. There is no guarding.  Musculoskeletal:     Comments: (+) 5/5 strength in upper and lower extremities  Lymphadenopathy:     Cervical: No cervical adenopathy.  Skin:    General: Skin is warm.  Neurological:     Mental Status: She is alert and oriented to person, place, and time.     Deep  Tendon Reflexes:     Reflex Scores:      Patellar reflexes are 2+ on the right side and 2+ on the left side. Psychiatric:        Judgment: Judgment normal.     BP 133/68 (BP Location: Right Arm, Patient Position: Sitting, Cuff Size: Small)   Pulse 68   Temp 98.1 F (36.7  C) (Oral)   Resp 16   Wt 115 lb (52.2 kg)   SpO2 100%   BMI 22.46 kg/m  Wt Readings from Last 3 Encounters:  07/02/22 115 lb (52.2 kg)  06/28/21 113 lb 3.2 oz (51.3 kg)  01/25/21 112 lb (50.8 kg)       Assessment & Plan:  Encounter for general adult medical examination with abnormal findings Assessment & Plan: Continue healthy diet and regular exercise. Encouraged her to get covid booster at her pharmacy.  Mammo will be due in March.  Pap and colo up to date. Encouraged lots of fresh fruits/veggies and water for constipation prevention.  Orders: -     TSH  Iron deficiency anemia, unspecified iron deficiency anemia type Assessment & Plan: Continues multivitamin with minerals. Obtain follow up iron studies.   Orders: -     Iron, TIBC and Ferritin Panel -     CBC with Differential/Platelet  Allergic rhinitis, unspecified seasonality, unspecified trigger Assessment & Plan: Uncontrolled. Recommended that she add claritin '10mg'$  once daily.   Orders: -     Loratadine; Take 1 tablet (10 mg total) by mouth daily.  Dispense: 30 tablet; Refill: 11  Breast cancer screening by mammogram -     3D Screening Mammogram, Left and Right; Future  Hyperlipidemia, unspecified hyperlipidemia type -     Comprehensive metabolic panel -     Lipid panel  Vitamin D deficiency Assessment & Plan: She takes an otc vit D supplement QOD. Recheck vit D level.   Orders: -     VITAMIN D 25 Hydroxy (Vit-D Deficiency, Fractures)  Nonintractable episodic headache, unspecified headache type Assessment & Plan: Hopefully claritin will help. Advised ok to use tylenol or aleve prn short term. She is advised to let me know if her  symptoms worsen or if symptoms do not improve.     I, Nance Pear, NP, personally preformed the services described in this documentation.  All medical record entries made by the scribe were at my direction and in my presence.  I have reviewed the chart and discharge instructions (if applicable) and agree that the record reflects my personal performance and is accurate and complete. 07/02/2022   Lacretia Leigh as a scribe for Nance Pear, NP.,have documented all relevant documentation on the behalf of Nance Pear, NP,as directed by  Nance Pear, NP while in the presence of Nance Pear, NP.  Nance Pear, NP

## 2022-07-02 NOTE — Assessment & Plan Note (Signed)
Continues multivitamin with minerals. Obtain follow up iron studies.

## 2022-07-02 NOTE — Assessment & Plan Note (Signed)
Hopefully claritin will help. Advised ok to use tylenol or aleve prn short term. She is advised to let me know if her symptoms worsen or if symptoms do not improve.

## 2022-07-02 NOTE — Assessment & Plan Note (Addendum)
Continue healthy diet and regular exercise. Encouraged her to get covid booster at her pharmacy.  Mammo will be due in March.  Pap and colo up to date. Encouraged lots of fresh fruits/veggies and water for constipation prevention.

## 2022-07-02 NOTE — Assessment & Plan Note (Signed)
She takes an otc vit D supplement QOD. Recheck vit D level.

## 2022-07-02 NOTE — Assessment & Plan Note (Signed)
Uncontrolled. Recommended that she add claritin '10mg'$  once daily.

## 2022-07-03 ENCOUNTER — Encounter: Payer: Self-pay | Admitting: Family

## 2022-07-03 LAB — IRON,TIBC AND FERRITIN PANEL
%SAT: 36 % (calc) (ref 16–45)
Ferritin: 61 ng/mL (ref 16–232)
Iron: 137 ug/dL (ref 45–160)
TIBC: 379 mcg/dL (calc) (ref 250–450)

## 2022-07-03 NOTE — Progress Notes (Signed)
Letter Sent.

## 2022-07-26 ENCOUNTER — Encounter (HOSPITAL_BASED_OUTPATIENT_CLINIC_OR_DEPARTMENT_OTHER): Payer: Self-pay

## 2022-07-26 ENCOUNTER — Ambulatory Visit (HOSPITAL_BASED_OUTPATIENT_CLINIC_OR_DEPARTMENT_OTHER)
Admission: RE | Admit: 2022-07-26 | Discharge: 2022-07-26 | Disposition: A | Discharge status: Home / Self Care | Payer: No Typology Code available for payment source | Source: Ambulatory Visit | Attending: Family | Admitting: Family

## 2022-07-26 DIAGNOSIS — Z1231 Encounter for screening mammogram for malignant neoplasm of breast: Secondary | ICD-10-CM | POA: Insufficient documentation

## 2023-05-02 IMAGING — MG MM DIGITAL SCREENING BILAT W/ TOMO AND CAD
8 series · 9 of 24 positions shown · non-contrast
Comparison: Previous exam(s).

CLINICAL DATA: Screening.

EXAM:
DIGITAL SCREENING BILATERAL MAMMOGRAM WITH TOMOSYNTHESIS AND CAD
TECHNIQUE: Bilateral screening digital craniocaudal and mediolateral oblique
mammograms were obtained. Bilateral screening digital breast
tomosynthesis was performed. The images were evaluated with
computer-aided detection.

[R CC synth-2D]
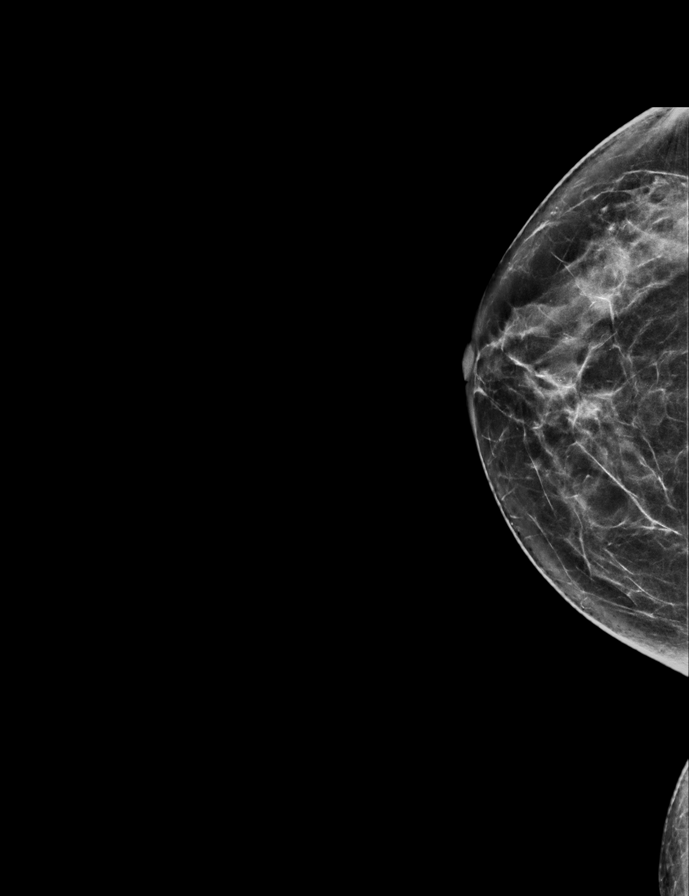

[L CC synth-2D]
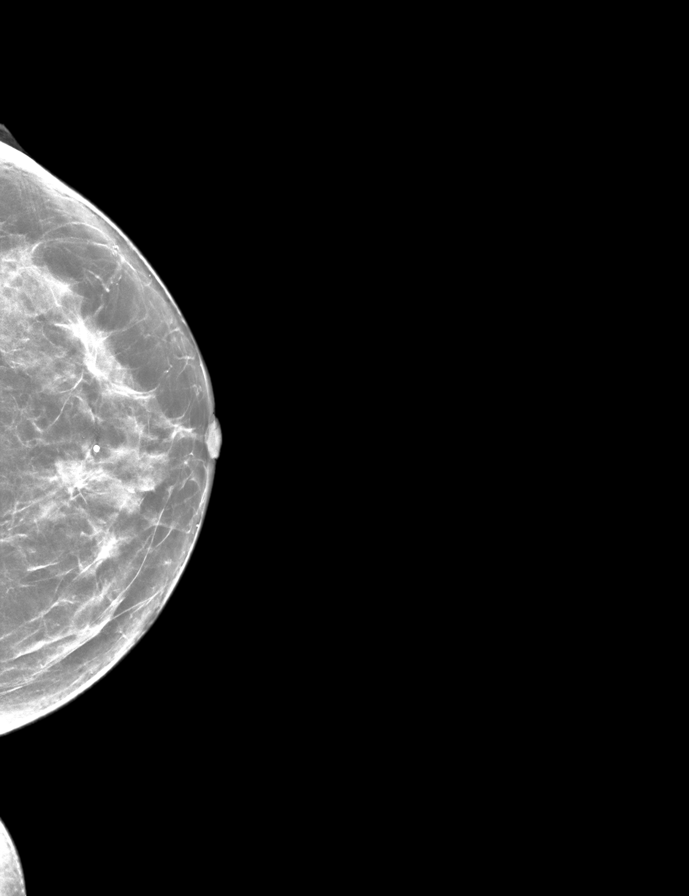

[L MLO synth-2D]
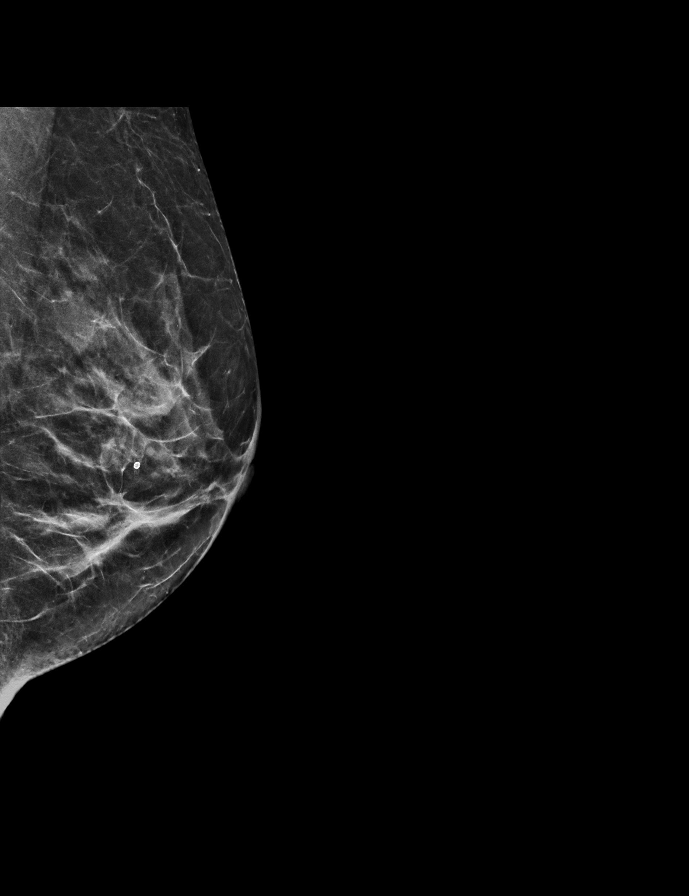

[R MLO synth-2D]
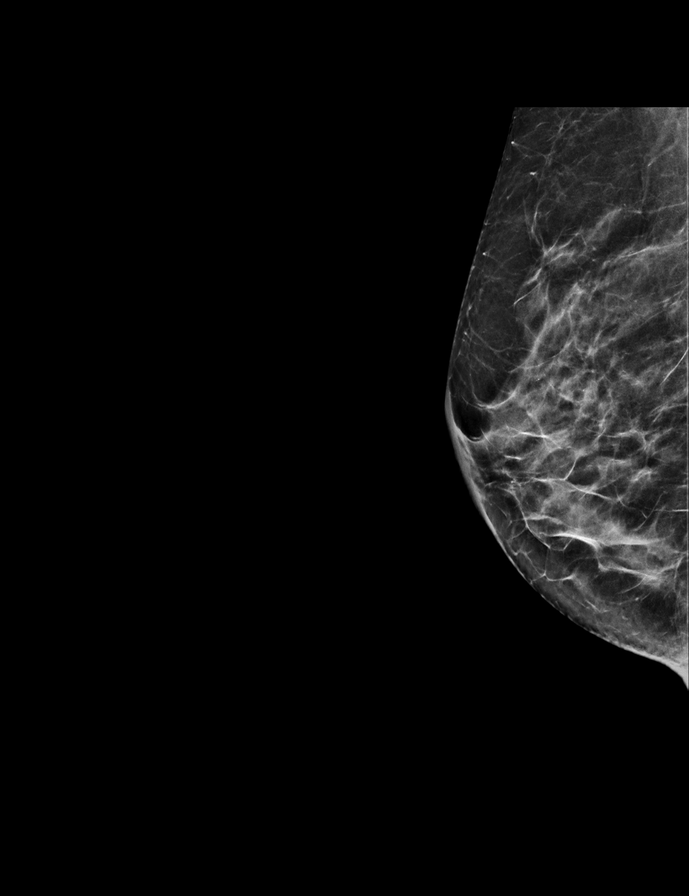

[R CC tomo · 2 of 60 frames shown]
[frame 20/60]
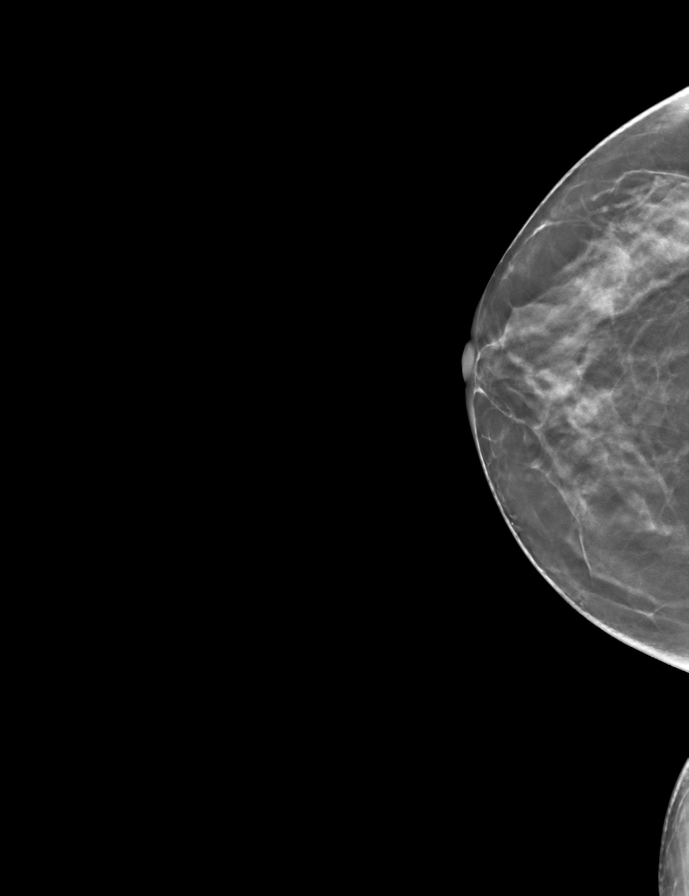
[frame 31/60]
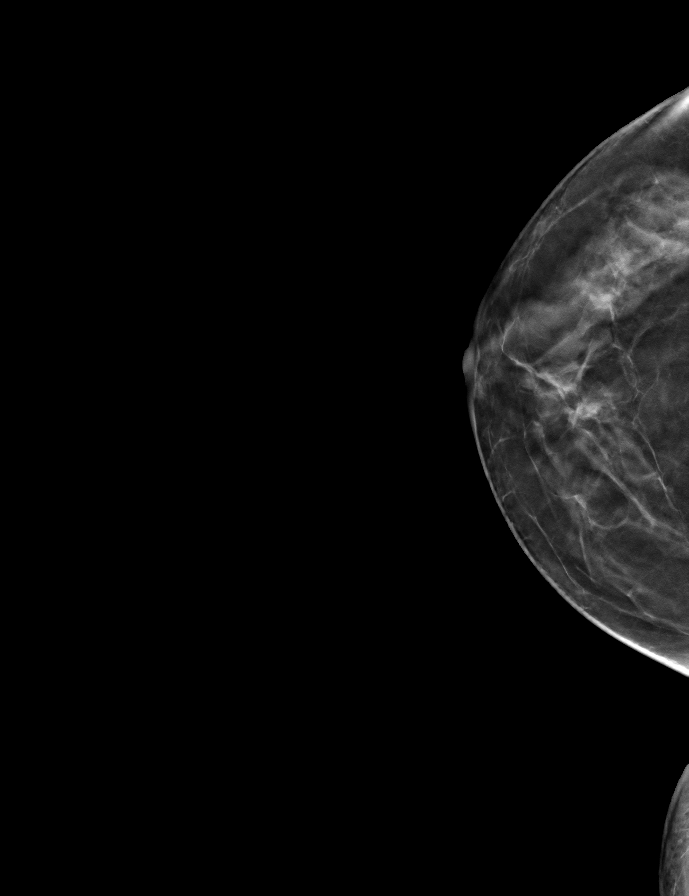

[L MLO tomo · tomo slice 29/56.0]
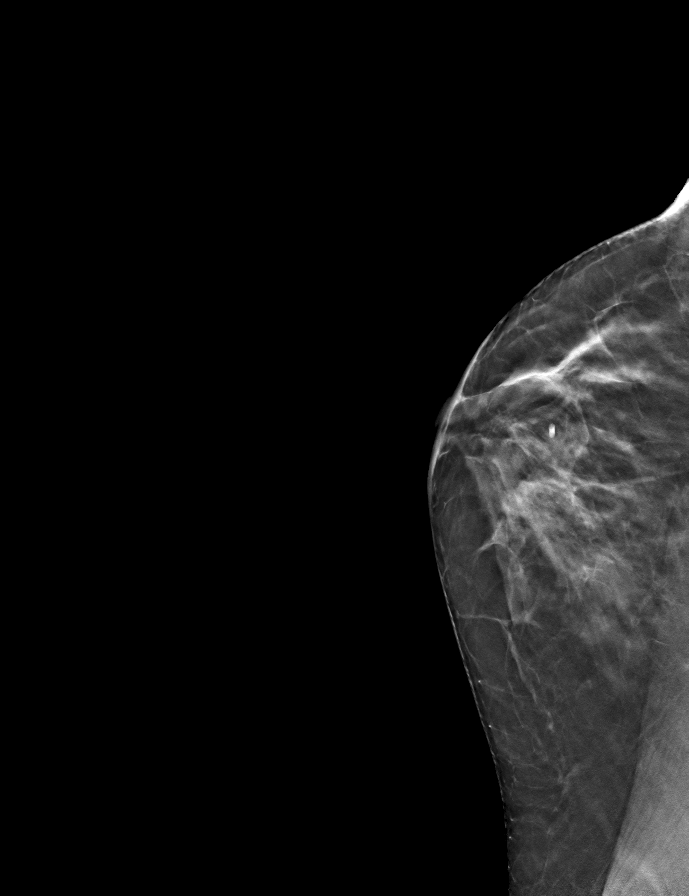

[L CC tomo · tomo slice 29/58.0]
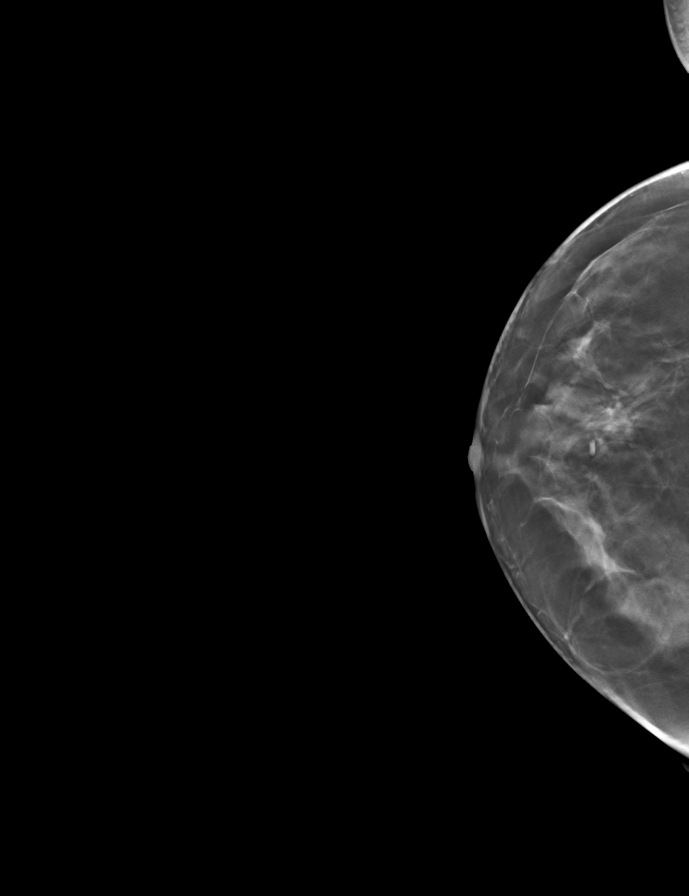

[R MLO tomo · tomo slice 29/56.0]
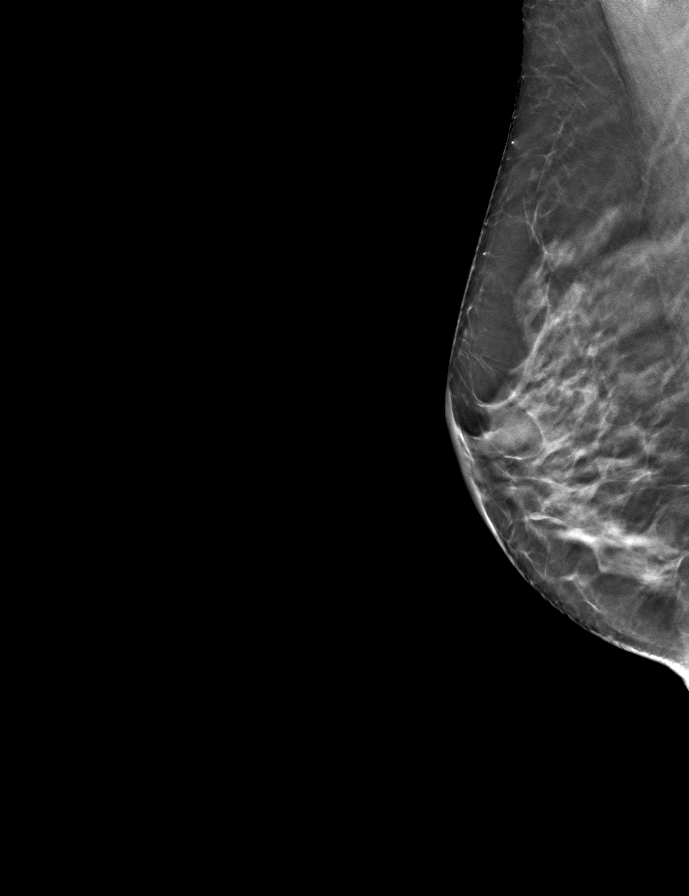

[9 of 24 positions shown; findings below may reference images not displayed]

ACR Breast Density Category c: The breast tissue is heterogeneously
dense, which may obscure small masses.
FINDINGS: There are no findings suspicious for malignancy.
IMPRESSION: No mammographic evidence of malignancy. A result letter of this
screening mammogram will be mailed directly to the patient.

RECOMMENDATION:
Screening mammogram in one year. (Code:Q3-W-BC3)

BI-RADS CATEGORY  1: Negative.

## 2023-06-21 ENCOUNTER — Ambulatory Visit: Payer: No Typology Code available for payment source | Admitting: Family

## 2023-06-21 ENCOUNTER — Encounter: Payer: Self-pay | Admitting: Family

## 2023-06-21 VITALS — BP 128/79 | HR 69 | Temp 98.0°F | Resp 16 | Ht 60.0 in | Wt 111.0 lb

## 2023-06-21 DIAGNOSIS — M8580 Other specified disorders of bone density and structure, unspecified site: Secondary | ICD-10-CM | POA: Diagnosis not present

## 2023-06-21 DIAGNOSIS — E785 Hyperlipidemia, unspecified: Secondary | ICD-10-CM

## 2023-06-21 DIAGNOSIS — Z23 Encounter for immunization: Secondary | ICD-10-CM

## 2023-06-21 DIAGNOSIS — D509 Iron deficiency anemia, unspecified: Secondary | ICD-10-CM | POA: Diagnosis not present

## 2023-06-21 DIAGNOSIS — J309 Allergic rhinitis, unspecified: Secondary | ICD-10-CM | POA: Diagnosis not present

## 2023-06-21 DIAGNOSIS — Z1231 Encounter for screening mammogram for malignant neoplasm of breast: Secondary | ICD-10-CM

## 2023-06-21 DIAGNOSIS — R059 Cough, unspecified: Secondary | ICD-10-CM | POA: Insufficient documentation

## 2023-06-21 LAB — LIPID PANEL
Cholesterol: 211 mg/dL — ABNORMAL HIGH (ref 0–200)
HDL: 47.7 mg/dL (ref >39.00)
LDL Cholesterol: 152 mg/dL — ABNORMAL HIGH (ref 0–99)
NonHDL: 163.17
Total CHOL/HDL Ratio: 4
Triglycerides: 57 mg/dL (ref 0.0–149.0)
VLDL: 11.4 mg/dL (ref 0.0–40.0)

## 2023-06-21 LAB — COMPREHENSIVE METABOLIC PANEL
ALT: 29 U/L (ref 0–35)
AST: 22 U/L (ref 0–37)
Albumin: 4.5 g/dL (ref 3.5–5.2)
Alkaline Phosphatase: 57 U/L (ref 39–117)
BUN: 19 mg/dL (ref 6–23)
CO2: 29 meq/L (ref 19–32)
Calcium: 9.5 mg/dL (ref 8.4–10.5)
Chloride: 106 meq/L (ref 96–112)
Creatinine, Ser: 0.73 mg/dL (ref 0.40–1.20)
GFR: 90.2 mL/min (ref >60.00)
Glucose, Bld: 96 mg/dL (ref 70–99)
Potassium: 5.2 meq/L — ABNORMAL HIGH (ref 3.5–5.1)
Sodium: 143 meq/L (ref 135–145)
Total Bilirubin: 0.7 mg/dL (ref 0.2–1.2)
Total Protein: 7.2 g/dL (ref 6.0–8.3)

## 2023-06-21 LAB — CBC WITH DIFFERENTIAL/PLATELET
Basophils Absolute: 0 10*3/uL (ref 0.0–0.1)
Basophils Relative: 0.5 % (ref 0.0–3.0)
Eosinophils Absolute: 0 10*3/uL (ref 0.0–0.7)
Eosinophils Relative: 0.9 % (ref 0.0–5.0)
HCT: 34.8 % — ABNORMAL LOW (ref 36.0–46.0)
Hemoglobin: 11.3 g/dL — ABNORMAL LOW (ref 12.0–15.0)
Lymphocytes Relative: 38 % (ref 12.0–46.0)
Lymphs Abs: 1.9 10*3/uL (ref 0.7–4.0)
MCHC: 32.4 g/dL (ref 30.0–36.0)
MCV: 79.2 fL (ref 78.0–100.0)
Monocytes Absolute: 0.4 10*3/uL (ref 0.1–1.0)
Monocytes Relative: 8.7 % (ref 3.0–12.0)
Neutro Abs: 2.6 10*3/uL (ref 1.4–7.7)
Neutrophils Relative %: 51.9 % (ref 43.0–77.0)
Platelets: 221 10*3/uL (ref 150.0–400.0)
RBC: 4.39 Mil/uL (ref 3.87–5.11)
RDW: 13.9 % (ref 11.5–15.5)
WBC: 4.9 10*3/uL (ref 4.0–10.5)

## 2023-06-21 NOTE — Addendum Note (Signed)
Addended by: Wilford Corner on: 06/21/2023 03:26 PM   Modules accepted: Orders

## 2023-06-21 NOTE — Assessment & Plan Note (Signed)
Clinically stable. Takes MVI with iron. Update CBC.

## 2023-06-21 NOTE — Assessment & Plan Note (Signed)
Currently stable. Not needing claritin this time of year.

## 2023-06-21 NOTE — Progress Notes (Signed)
Subjective:     Patient ID: Nicole Schneider, female    DOB: Feb 26, 1966, 58 y.o.   MRN: [id number]  Chief Complaint  Patient presents with   Follow-up   Anemia    Here for follow up   Cough    Complains of occasional cough since monday     Discussed the use of AI scribe software for clinical note transcription with the patient, who gave verbal consent to proceed.  History of Present Illness        Patient is a 58 yr old female who presents today for routine follow up.  She notes a mild cough and decreased appetite for 1 week but has not been feeling ill otherwise.  Notes improvement in appetite today.   Lab Results  Component Value Date   WBC 5.1 07/02/2022   HGB 11.7 (L) 07/02/2022   HCT 36.8 07/02/2022   MCV 79.5 07/02/2022   PLT 235.0 07/02/2022   Wt Readings from Last 3 Encounters:  06/21/23 111 lb (50.3 kg)  07/02/22 115 lb (52.2 kg)  06/28/21 113 lb 3.2 oz (51.3 kg)   Hyperlipidemia- not on statin.  Diet only.  She will be due for her follow up mammogram in the end of March.    Denies any recent allergy symptoms and has not been needing to use claritin recently.      Health Maintenance Due  Topic Date Due   INFLUENZA VACCINE  12/06/2022   COVID-19 Vaccine (5 - 2024-25 season) 01/06/2023    Past Medical History:  Diagnosis Date   Allergy    Iron deficiency anemia    Osteopenia    Seasonal allergies     Past Surgical History:  Procedure Laterality Date   miscarriage      Family History  Problem Relation Age of Onset   Colon cancer Neg Hx     Social History   Socioeconomic History   Marital status: Married    Spouse name: Not on file   Number of children: Not on file   Years of education: Not on file   Highest education level: Not on file  Occupational History   Not on file  Tobacco Use   Smoking status: Never   Smokeless tobacco: Never  Substance and Sexual Activity   Alcohol use: No    Alcohol/week: 0.0 standard drinks of alcohol    Drug use: No   Sexual activity: Not Currently  Other Topics Concern   Not on file  Social History Narrative   Works in [place]   Married   2 sons.   Grew up in [place].       Social Drivers of Corporate investment banker Strain: Not on file  Food Insecurity: Not on file  Transportation Needs: Not on file  Physical Activity: Not on file  Stress: Not on file  Social Connections: Not on file  Intimate Partner Violence: Not on file    Outpatient Medications Prior to Visit  Medication Sig Dispense Refill   Cholecalciferol (VITAMIN D3) 3000 units TABS Take 1 tablet by mouth daily. 30 tablet    loratadine (CLARITIN) 10 MG tablet Take 1 tablet (10 mg total) by mouth daily. 30 tablet 11   Multiple Vitamins-Minerals (ONE DAILY MULTIVITAMIN WOMEN PO) Take 1 tablet by mouth daily.     No facility-administered medications prior to visit.    No Known Allergies  Review of Systems  Respiratory:  Positive for cough.  Objective:    Physical Exam Constitutional:      General: She is not in acute distress.    Appearance: Normal appearance. She is well-developed.  HENT:     Head: Normocephalic and atraumatic.     Right Ear: Tympanic membrane, ear canal and external ear normal.     Left Ear: Tympanic membrane, ear canal and external ear normal.  Eyes:     General: No scleral icterus. Neck:     Thyroid: No thyromegaly.  Cardiovascular:     Rate and Rhythm: Normal rate and regular rhythm.     Heart sounds: Normal heart sounds. No murmur heard. Pulmonary:     Effort: Pulmonary effort is normal. No respiratory distress.     Breath sounds: Normal breath sounds. No wheezing.  Musculoskeletal:     Cervical back: Neck supple.  Skin:    General: Skin is warm and dry.  Neurological:     Mental Status: She is alert and oriented to person, place, and time.  Psychiatric:        Mood and Affect: Mood normal.        Behavior: Behavior normal.        Thought Content: Thought  content normal.        Judgment: Judgment normal.      BP 128/79 (BP Location: Right Arm, Patient Position: Sitting, Cuff Size: Small)   Pulse 69   Temp 98 F (36.7 C) (Oral)   Resp 16   Ht 5' (1.524 m)   Wt 111 lb (50.3 kg)   SpO2 100%   BMI 21.68 kg/m  Wt Readings from Last 3 Encounters:  06/21/23 111 lb (50.3 kg)  07/02/22 115 lb (52.2 kg)  06/28/21 113 lb 3.2 oz (51.3 kg)       Assessment & Plan:   Problem List Items Addressed This Visit       Unprioritized   Osteopenia   Continue vit D and calcium. Update dexa.       Relevant Orders   DG Bone Density   Iron deficiency anemia   Clinically stable. Takes MVI with iron. Update CBC.       Relevant Orders   Comp Met (CMET)   CBC w/Diff   Cough   Mild, was accompanied by anorexia which is now improved. Suspect mild viral illness which is improving.      Allergic rhinitis   Currently stable. Not needing claritin this time of year.       Other Visit Diagnoses       Breast cancer screening by mammogram    -  Primary   Relevant Orders   MM 3D SCREENING MAMMOGRAM BILATERAL BREAST     Hyperlipidemia, unspecified hyperlipidemia type       Relevant Orders   Lipid panel      Flu shot today.   I am having Kahleah Charlann maintain her Multiple Vitamins-Minerals (ONE DAILY MULTIVITAMIN WOMEN PO), Vitamin D3, and loratadine.  No orders of the defined types were placed in this encounter.

## 2023-06-21 NOTE — Assessment & Plan Note (Signed)
Continue vit D and calcium. Update dexa.

## 2023-06-21 NOTE — Assessment & Plan Note (Signed)
Mild, was accompanied by anorexia which is now improved. Suspect mild viral illness which is improving.

## 2023-06-24 NOTE — Progress Notes (Signed)
Mailed out to patient 

## 2023-08-01 ENCOUNTER — Ambulatory Visit (HOSPITAL_BASED_OUTPATIENT_CLINIC_OR_DEPARTMENT_OTHER)
Admission: RE | Admit: 2023-08-01 | Discharge: 2023-08-01 | Disposition: A | Discharge status: Home / Self Care | Payer: No Typology Code available for payment source | Source: Ambulatory Visit | Attending: Family | Admitting: Family

## 2023-08-01 ENCOUNTER — Encounter (HOSPITAL_BASED_OUTPATIENT_CLINIC_OR_DEPARTMENT_OTHER): Payer: Self-pay

## 2023-08-01 DIAGNOSIS — M8580 Other specified disorders of bone density and structure, unspecified site: Secondary | ICD-10-CM | POA: Insufficient documentation

## 2023-08-01 DIAGNOSIS — Z1231 Encounter for screening mammogram for malignant neoplasm of breast: Secondary | ICD-10-CM

## 2023-08-02 ENCOUNTER — Encounter: Payer: Self-pay | Admitting: Family

## 2023-08-05 NOTE — Progress Notes (Signed)
 Mailed out to pt

## 2023-10-04 ENCOUNTER — Encounter: Payer: Self-pay | Admitting: Family Medicine

## 2023-10-04 ENCOUNTER — Ambulatory Visit: Admitting: Family Medicine

## 2023-10-04 ENCOUNTER — Ambulatory Visit (HOSPITAL_BASED_OUTPATIENT_CLINIC_OR_DEPARTMENT_OTHER)
Admission: RE | Admit: 2023-10-04 | Discharge: 2023-10-04 | Disposition: A | Discharge status: Home / Self Care | Source: Ambulatory Visit | Attending: Family Medicine | Admitting: Family Medicine

## 2023-10-04 VITALS — BP 100/80 | HR 73 | Temp 98.1°F | Resp 16 | Ht 60.0 in | Wt 114.0 lb

## 2023-10-04 DIAGNOSIS — R221 Localized swelling, mass and lump, neck: Secondary | ICD-10-CM

## 2023-10-04 LAB — COMPREHENSIVE METABOLIC PANEL WITH GFR
ALT: 21 U/L (ref 0–35)
AST: 25 U/L (ref 0–37)
Albumin: 4.8 g/dL (ref 3.5–5.2)
Alkaline Phosphatase: 60 U/L (ref 39–117)
BUN: 16 mg/dL (ref 6–23)
CO2: 29 meq/L (ref 19–32)
Calcium: 9.7 mg/dL (ref 8.4–10.5)
Chloride: 106 meq/L (ref 96–112)
Creatinine, Ser: 0.8 mg/dL (ref 0.40–1.20)
GFR: 80.65 mL/min (ref >60.00)
Glucose, Bld: 98 mg/dL (ref 70–99)
Potassium: 4.8 meq/L (ref 3.5–5.1)
Sodium: 143 meq/L (ref 135–145)
Total Bilirubin: 0.7 mg/dL (ref 0.2–1.2)
Total Protein: 7.4 g/dL (ref 6.0–8.3)

## 2023-10-04 LAB — CBC WITH DIFFERENTIAL/PLATELET
Basophils Absolute: 0 10*3/uL (ref 0.0–0.1)
Basophils Relative: 0.4 % (ref 0.0–3.0)
Eosinophils Absolute: 0.1 10*3/uL (ref 0.0–0.7)
Eosinophils Relative: 1.5 % (ref 0.0–5.0)
HCT: 36 % (ref 36.0–46.0)
Hemoglobin: 11.4 g/dL — ABNORMAL LOW (ref 12.0–15.0)
Lymphocytes Relative: 37.2 % (ref 12.0–46.0)
Lymphs Abs: 1.9 10*3/uL (ref 0.7–4.0)
MCHC: 31.8 g/dL (ref 30.0–36.0)
MCV: 78.3 fl (ref 78.0–100.0)
Monocytes Absolute: 0.3 10*3/uL (ref 0.1–1.0)
Monocytes Relative: 6 % (ref 3.0–12.0)
Neutro Abs: 2.9 10*3/uL (ref 1.4–7.7)
Neutrophils Relative %: 54.9 % (ref 43.0–77.0)
Platelets: 231 10*3/uL (ref 150.0–400.0)
RBC: 4.6 Mil/uL (ref 3.87–5.11)
RDW: 14.3 % (ref 11.5–15.5)
WBC: 5.2 10*3/uL (ref 4.0–10.5)

## 2023-10-04 NOTE — Progress Notes (Signed)
 Established Patient Office Visit  Subjective   Patient ID: Nicole Schneider, female    DOB: 02-26-1966  Age: 58 y.o. MRN: [id number]  Chief Complaint  Patient presents with   Throat concern    Pt states throat doesn't feel clear. No pain.     HPI Discussed the use of AI scribe software for clinical note transcription with the patient, who gave verbal consent to proceed.  History of Present Illness [name] is a 58 year old female who presents with a persistent sensation of something stuck in her throat for over a year.  She experiences a persistent sensation of something being stuck in her throat when swallowing, described as a 'scratchy' feeling, as if something is on the wall of her throat. This sensation has persisted for over a year without pain or food getting stuck. No heartburn is present.  She was previously given an oral medication, which she took for one month, but the sensation did not resolve and has remained constant throughout the year.  She recently experienced a brief episode of coughing last week, during which she coughed up mucus a few times, but this has since resolved.  Her current medications include vitamin D3, which she takes regularly but not daily, a multivitamin, and occasionally probiotics, fish oil, and magnesium, which she takes two to three days a week.  No family history of thyroid  problems.   Patient Active Problem List   Diagnosis Date Noted   Cough 06/21/2023   Vitamin D  deficiency 07/02/2022   Allergic rhinitis 07/02/2022   Nonintractable episodic headache 07/02/2022   Gastroesophageal reflux disease 01/25/2021   Lumbar radiculopathy 09/30/2020   Osteopenia    Routine general medical examination at a health care facility 03/30/2013   Iron deficiency anemia 06/13/2009   CARPAL TUNNEL SYNDROME, BILATERAL 06/03/2009   Past Medical History:  Diagnosis Date   Allergy    Iron deficiency anemia    Osteopenia    Seasonal allergies    Past  Surgical History:  Procedure Laterality Date   miscarriage     Social History   Tobacco Use   Smoking status: Never   Smokeless tobacco: Never  Substance Use Topics   Alcohol use: No    Alcohol/week: 0.0 standard drinks of alcohol   Drug use: No   Social History   Socioeconomic History   Marital status: Married    Spouse name: Not on file   Number of children: Not on file   Years of education: Not on file   Highest education level: Not on file  Occupational History   Not on file  Tobacco Use   Smoking status: Never   Smokeless tobacco: Never  Substance and Sexual Activity   Alcohol use: No    Alcohol/week: 0.0 standard drinks of alcohol   Drug use: No   Sexual activity: Not Currently  Other Topics Concern   Not on file  Social History Narrative   Works in [employer]   Married   2 sons.   Grew up in [place].       Social Drivers of Corporate investment banker Strain: Not on file  Food Insecurity: Not on file  Transportation Needs: Not on file  Physical Activity: Not on file  Stress: Not on file  Social Connections: Not on file  Intimate Partner Violence: Not on file   Family Status  Relation Name Status   Mother  Deceased at age 78        unknown  causes   Father  Deceased at age 55   Other siblings Alive       9 siblings, all healthy per pt knowledge   Neg Hx  (Not Specified)  No partnership data on file   Family History  Problem Relation Age of Onset   Colon cancer Neg Hx    No Known Allergies    Review of Systems  Constitutional:  Negative for fever and malaise/fatigue.  HENT:  Negative for congestion.   Eyes:  Negative for blurred vision.  Respiratory:  Negative for cough and shortness of breath.   Cardiovascular:  Negative for chest pain, palpitations and leg swelling.  Gastrointestinal:  Negative for abdominal pain, blood in stool, nausea and vomiting.  Genitourinary:  Negative for dysuria and frequency.  Musculoskeletal:  Negative for back  pain and falls.  Skin:  Negative for rash.  Neurological:  Negative for dizziness, loss of consciousness and headaches.  Endo/Heme/Allergies:  Negative for environmental allergies.  Psychiatric/Behavioral:  Negative for depression. The patient is not nervous/anxious.       Objective:     BP 100/80 (BP Location: Left Arm, Patient Position: Sitting, Cuff Size: Normal)   Pulse 73   Temp 98.1 F (36.7 C) (Oral)   Resp 16   Ht 5' (1.524 m)   Wt 114 lb (51.7 kg)   SpO2 100%   BMI 22.26 kg/m  BP Readings from Last 3 Encounters:  10/04/23 100/80  06/21/23 128/79  07/02/22 133/68   Wt Readings from Last 3 Encounters:  10/04/23 114 lb (51.7 kg)  06/21/23 111 lb (50.3 kg)  07/02/22 115 lb (52.2 kg)   SpO2 Readings from Last 3 Encounters:  10/04/23 100%  06/21/23 100%  07/02/22 100%      Physical Exam Vitals and nursing note reviewed.  Constitutional:      General: She is not in acute distress.    Appearance: Normal appearance. She is well-developed.  HENT:     Head: Normocephalic and atraumatic.  Eyes:     General: No scleral icterus.       Right eye: No discharge.        Left eye: No discharge.  Neck:     Thyroid : Thyromegaly present.  Cardiovascular:     Rate and Rhythm: Normal rate and regular rhythm.     Heart sounds: No murmur heard. Pulmonary:     Effort: Pulmonary effort is normal. No respiratory distress.     Breath sounds: Normal breath sounds.  Musculoskeletal:        General: Normal range of motion.     Cervical back: Normal range of motion and neck supple.     Right lower leg: No edema.     Left lower leg: No edema.  Skin:    General: Skin is warm and dry.  Neurological:     Mental Status: She is alert and oriented to person, place, and time.  Psychiatric:        Mood and Affect: Mood normal.        Behavior: Behavior normal.        Thought Content: Thought content normal.        Judgment: Judgment normal.      No results found for any  visits on 10/04/23.  Last CBC Lab Results  Component Value Date   WBC 4.9 06/21/2023   HGB 11.3 (L) 06/21/2023   HCT 34.8 (L) 06/21/2023   MCV 79.2 06/21/2023   MCH 25.9 (L) 09/21/2013  RDW 13.9 06/21/2023   PLT 221.0 06/21/2023   Last metabolic panel Lab Results  Component Value Date   GLUCOSE 96 06/21/2023   NA 143 06/21/2023   K 5.2 No hemolysis seen (H) 06/21/2023   CL 106 06/21/2023   CO2 29 06/21/2023   BUN 19 06/21/2023   CREATININE 0.73 06/21/2023   GFR 90.20 06/21/2023   CALCIUM 9.5 06/21/2023   PROT 7.2 06/21/2023   ALBUMIN 4.5 06/21/2023   BILITOT 0.7 06/21/2023   ALKPHOS 57 06/21/2023   AST 22 06/21/2023   ALT 29 06/21/2023   Last lipids Lab Results  Component Value Date   CHOL 211 (H) 06/21/2023   HDL 47.70 06/21/2023   LDLCALC 152 (H) 06/21/2023   TRIG 57.0 06/21/2023   CHOLHDL 4 06/21/2023   Last hemoglobin A1c No results found for: "HGBA1C" Last thyroid  functions Lab Results  Component Value Date   TSH 1.47 07/02/2022   Last vitamin D  Lab Results  Component Value Date   VD25OH 51.09 07/02/2022   Last vitamin B12 and Folate Lab Results  Component Value Date   VITAMINB12 995 (H) 07/01/2009   FOLATE >20.0 ng/mL 07/01/2009      The 10-year ASCVD risk score (Arnett DK, et al., 2019) is: 2.1%    Assessment & Plan:   Problem List Items Addressed This Visit   None Visit Diagnoses       Localized swelling, mass and lump, neck    -  Primary   Relevant Orders   US  SOFT TISSUE HEAD & NECK (NON-THYROID )   CBC with Differential/Platelet   Comprehensive metabolic panel with GFR   Thyroid  Panel With TSH     Assessment and Plan Assessment & Plan Chronic throat discomfort   She experiences a persistent sensation of a foreign body in her throat for over a year, without pain or heartburn. There is no dysphagia or significant cough currently. Previous oral medication was ineffective. A recent mild cough with mucus production has resolved.  Order blood work to evaluate potential underlying causes. Refer to ENT for further evaluation if blood work is normal.  Neck swelling   Possible neck swelling was observed on physical examination. There is no family history of thyroid  disorders. Order a neck ultrasound to assess for thyroid  or other neck pathology.  64  No follow-ups on file.    Vilas Edgerly R Lowne Chase, DO

## 2023-10-04 NOTE — Addendum Note (Signed)
 Addended by: Roel Clarity R on: 10/04/2023 10:24 AM   Modules accepted: Orders

## 2023-10-05 LAB — THYROID PANEL WITH TSH
Free Thyroxine Index: 3.1 (ref 1.4–3.8)
T3 Uptake: 34 % (ref 22–35)
T4, Total: 9 ug/dL (ref 5.1–11.9)
TSH: 1.01 m[IU]/L (ref 0.40–4.50)

## 2023-10-06 ENCOUNTER — Ambulatory Visit: Payer: Self-pay | Admitting: Family Medicine

## 2023-10-06 ENCOUNTER — Telehealth: Payer: Self-pay | Admitting: Family

## 2023-10-06 DIAGNOSIS — R221 Localized swelling, mass and lump, neck: Secondary | ICD-10-CM

## 2023-10-06 NOTE — Telephone Encounter (Signed)
 Please advise pt that her ultrasound is normal.  Is she still feeling like something in her throat? If so, please place referral to ENT.

## 2023-10-07 NOTE — Telephone Encounter (Signed)
 Left message for pt to return my call.

## 2023-10-08 NOTE — Telephone Encounter (Signed)
 Referral to ent initiated

## 2023-10-08 NOTE — Telephone Encounter (Signed)
 Spoke to patient's son, Rollen Clines. He was given information from provider and he reports she is still having trouble swallowing.

## 2023-11-07 ENCOUNTER — Encounter (INDEPENDENT_AMBULATORY_CARE_PROVIDER_SITE_OTHER): Payer: Self-pay | Admitting: Otolaryngology

## 2023-12-20 ENCOUNTER — Ambulatory Visit: Payer: No Typology Code available for payment source | Admitting: Family

## 2023-12-20 ENCOUNTER — Other Ambulatory Visit (HOSPITAL_BASED_OUTPATIENT_CLINIC_OR_DEPARTMENT_OTHER): Payer: Self-pay

## 2023-12-20 VITALS — BP 133/65 | HR 71 | Temp 98.9°F | Resp 16 | Ht 60.0 in | Wt 114.4 lb

## 2023-12-20 DIAGNOSIS — D509 Iron deficiency anemia, unspecified: Secondary | ICD-10-CM

## 2023-12-20 DIAGNOSIS — M5416 Radiculopathy, lumbar region: Secondary | ICD-10-CM | POA: Diagnosis not present

## 2023-12-20 DIAGNOSIS — J309 Allergic rhinitis, unspecified: Secondary | ICD-10-CM

## 2023-12-20 DIAGNOSIS — R09A2 Foreign body sensation, throat: Secondary | ICD-10-CM | POA: Insufficient documentation

## 2023-12-20 DIAGNOSIS — K219 Gastro-esophageal reflux disease without esophagitis: Secondary | ICD-10-CM | POA: Diagnosis not present

## 2023-12-20 DIAGNOSIS — E559 Vitamin D deficiency, unspecified: Secondary | ICD-10-CM | POA: Diagnosis not present

## 2023-12-20 DIAGNOSIS — M8580 Other specified disorders of bone density and structure, unspecified site: Secondary | ICD-10-CM

## 2023-12-20 MED ORDER — CALCIUM CARB-CHOLECALCIFEROL 600-5 MG-MCG PO TABS: 1.0000 | ORAL_TABLET | Freq: Two times a day (BID) | ORAL | Status: AC

## 2023-12-20 MED ORDER — OMEPRAZOLE 20 MG PO CPDR
20.0000 mg | DELAYED_RELEASE_CAPSULE | Freq: Every day | ORAL | 3 refills | Status: AC
  Filled 2023-12-20: qty 30, 30d supply, fill #0

## 2023-12-20 NOTE — Assessment & Plan Note (Signed)
 Trial of omeprazole . Advised pt to take for 1 month. If symptoms resolve symptoms likely gerd related. If symptoms do not resolve, recommend that she d/c omeprazole  at that time.

## 2023-12-20 NOTE — Assessment & Plan Note (Addendum)
 Dexa up to dat. Recommend that she add calcium  600mg  + D bid.

## 2023-12-20 NOTE — Assessment & Plan Note (Signed)
 On MVI with minerals.

## 2023-12-20 NOTE — Assessment & Plan Note (Signed)
 No back pain or radiculopathy.

## 2023-12-20 NOTE — Assessment & Plan Note (Signed)
 Continues vit D. Last level was WNL.

## 2023-12-20 NOTE — Progress Notes (Signed)
 Subjective:     Patient ID: Nicole Schneider, female    DOB: Sep 14, 1965, 58 y.o.   MRN: [id number]  Chief Complaint  Patient presents with   Anemia    Here for follow up    Anemia    Discussed the use of AI scribe software for clinical note transcription with the patient, who gave verbal consent to proceed.  History of Present Illness  Nicole Schneider is a 58 year old female who presents for a follow-up visit. Unfortunately our video interpreter system was not working properly at today's visit so visit was conducted in [place].  She was seen by my colleague back in June for globus sensation. TFT's and thyroid  ultrasound were normal. She continues to have a mild sensation that something is there in her throat. Denies obvious heartburn symptoms.  Blood tests from June showed normal thyroid  function test, kidney, liver function, and blood sugar levels, with slight anemia. She takes vitamin D  and calcium  irregularly and denies taking Claritin .     Health Maintenance Due  Topic Date Due   Hepatitis B Vaccines 19-59 Average Risk (1 of 3 - 19+ 3-dose series) Never done   Pneumococcal Vaccine: 50+ Years (1 of 1 - PCV) Never done   COVID-19 Vaccine (5 - 2024-25 season) 01/06/2023   INFLUENZA VACCINE  12/06/2023    Past Medical History:  Diagnosis Date   Allergy    Iron deficiency anemia    Osteopenia    Seasonal allergies     Past Surgical History:  Procedure Laterality Date   miscarriage      Family History  Problem Relation Age of Onset   Colon cancer Neg Hx     Social History   Socioeconomic History   Marital status: Married    Spouse name: Not on file   Number of children: Not on file   Years of education: Not on file   Highest education level: Not on file  Occupational History   Not on file  Tobacco Use   Smoking status: Never   Smokeless tobacco: Never  Substance and Sexual Activity   Alcohol use: No    Alcohol/week: 0.0 standard drinks of alcohol   Drug  use: No   Sexual activity: Not Currently  Other Topics Concern   Not on file  Social History Narrative   Works in [employer]   Married   2 sons.   Grew up in [place].       Social Drivers of Corporate investment banker Strain: Not on file  Food Insecurity: Not on file  Transportation Needs: Not on file  Physical Activity: Not on file  Stress: Not on file  Social Connections: Not on file  Intimate Partner Violence: Not on file    Outpatient Medications Prior to Visit  Medication Sig Dispense Refill   Cholecalciferol  (VITAMIN D3) 3000 units TABS Take 1 tablet by mouth daily. 30 tablet    loratadine  (CLARITIN ) 10 MG tablet Take 1 tablet (10 mg total) by mouth daily. 30 tablet 11   Multiple Vitamins-Minerals (ONE DAILY MULTIVITAMIN WOMEN PO) Take 1 tablet by mouth daily.     No facility-administered medications prior to visit.    No Known Allergies  ROS    See HPI Objective:    Physical Exam Constitutional:      General: She is not in acute distress.    Appearance: Normal appearance. She is well-developed.  HENT:     Head: Normocephalic and atraumatic.  Right Ear: External ear normal.     Left Ear: External ear normal.  Eyes:     General: No scleral icterus. Neck:     Thyroid : No thyroid  mass or thyromegaly.  Cardiovascular:     Rate and Rhythm: Normal rate and regular rhythm.     Heart sounds: Normal heart sounds. No murmur heard. Pulmonary:     Effort: Pulmonary effort is normal. No respiratory distress.     Breath sounds: Normal breath sounds. No wheezing.  Musculoskeletal:     Cervical back: Neck supple.  Lymphadenopathy:     Cervical: No cervical adenopathy.  Skin:    General: Skin is warm and dry.  Neurological:     Mental Status: She is alert and oriented to person, place, and time.  Psychiatric:        Mood and Affect: Mood normal.        Behavior: Behavior normal.        Thought Content: Thought content normal.        Judgment: Judgment normal.       BP 133/65 (BP Location: Right Arm, Patient Position: Sitting, Cuff Size: Small)   Pulse 71   Temp 98.9 F (37.2 C) (Oral)   Resp 16   Ht 5' (1.524 m)   Wt 114 lb 6.4 oz (51.9 kg)   SpO2 100%   BMI 22.34 kg/m  Wt Readings from Last 3 Encounters:  12/20/23 114 lb 6.4 oz (51.9 kg)  10/04/23 114 lb (51.7 kg)  06/21/23 111 lb (50.3 kg)       Assessment & Plan:   Problem List Items Addressed This Visit       Unprioritized   Vitamin D  deficiency - Primary   Continues vit D. Last level was WNL.      Osteopenia   Dexa up to dat. Recommend that she add calcium  600mg  + D bid.      Relevant Medications   Calcium  Carb-Cholecalciferol  (CALCIUM  600 + D) 600-5 MG-MCG TABS   Lumbar radiculopathy   No back pain or radiculopathy.       Iron deficiency anemia   On MVI with minerals.       Globus sensation   Trial of omeprazole . Advised pt to take for 1 month. If symptoms resolve symptoms likely gerd related. If symptoms do not resolve, recommend that she d/c omeprazole  at that time.      Relevant Medications   omeprazole  (PRILOSEC) 20 MG capsule   Gastroesophageal reflux disease   Denies acid reflux.        Relevant Medications   omeprazole  (PRILOSEC) 20 MG capsule   Allergic rhinitis   Currently stable without claritin .       I am having Romaine Kayelee start on Calcium  Carb-Cholecalciferol  and omeprazole . I am also having her maintain her Multiple Vitamins-Minerals (ONE DAILY MULTIVITAMIN WOMEN PO), Vitamin D3, and loratadine .  Meds ordered this encounter  Medications   Calcium  Carb-Cholecalciferol  (CALCIUM  600 + D) 600-5 MG-MCG TABS    Sig: Take 1 tablet by mouth 2 (two) times daily.    Supervising Provider:   DOMENICA BLACKBIRD A [[id number]]   omeprazole  (PRILOSEC) 20 MG capsule    Sig: Take 1 capsule (20 mg total) by mouth daily.    Dispense:  30 capsule    Refill:  3    Supervising Provider:   DOMENICA BLACKBIRD A [[id number]]

## 2023-12-20 NOTE — Assessment & Plan Note (Signed)
 Currently stable without claritin .

## 2023-12-20 NOTE — Patient Instructions (Addendum)
 VISIT SUMMARY:  You came in for a follow-up visit to discuss your ongoing symptoms and review previous test results. We addressed your heartburn and the sensation in your throat, and we also reviewed your slight anemia and general health maintenance.  YOUR PLAN:  POSSIBLE GASTROESOPHAGEAL REFLUX DISEASE (GERD): You have a sensation of something stuck in your throat, which may be due to GERD. Your previous ultrasound was normal. -Take omeprazole  once daily for one month. -Pick up your prescription from downstairs pharmacy. -If your symptoms improve after one month, continue taking omeprazole . -If there is no improvement after one month, discontinue omeprazole .  ANEMIA, UNSPECIFIED: Your previous tests showed slight anemia, but you have no new symptoms. -Continue your multivitamin with minerals.  GENERAL HEALTH MAINTENANCE: You are currently taking vitamin D  and calcium  irregularly. -Take calcium  600 mg twice daily.

## 2023-12-20 NOTE — Assessment & Plan Note (Signed)
 Denies acid reflux.

## 2024-07-24 ENCOUNTER — Encounter: Admitting: Family
